# Patient Record
Sex: Female | Born: 1955 | Race: White | Hispanic: No | Smoking: Never smoker
Health system: Southern US, Community
[De-identification: ages and names within clinical notes are randomized; demographics above are authoritative.]

## PROBLEM LIST (undated history)

## (undated) DIAGNOSIS — F419 Anxiety disorder, unspecified: Secondary | ICD-10-CM

## (undated) DIAGNOSIS — J309 Allergic rhinitis, unspecified: Secondary | ICD-10-CM

## (undated) DIAGNOSIS — M1990 Unspecified osteoarthritis, unspecified site: Secondary | ICD-10-CM

## (undated) DIAGNOSIS — Z860100 Personal history of colon polyps, unspecified: Secondary | ICD-10-CM

## (undated) DIAGNOSIS — N951 Menopausal and female climacteric states: Secondary | ICD-10-CM

## (undated) DIAGNOSIS — I10 Essential (primary) hypertension: Secondary | ICD-10-CM

## (undated) DIAGNOSIS — E785 Hyperlipidemia, unspecified: Secondary | ICD-10-CM

## (undated) DIAGNOSIS — E669 Obesity, unspecified: Secondary | ICD-10-CM

## (undated) DIAGNOSIS — K5792 Diverticulitis of intestine, part unspecified, without perforation or abscess without bleeding: Secondary | ICD-10-CM

## (undated) DIAGNOSIS — E7800 Pure hypercholesterolemia, unspecified: Secondary | ICD-10-CM

## (undated) HISTORY — DX: Essential (primary) hypertension: I10

## (undated) HISTORY — DX: Personal history of colon polyps, unspecified: Z86.0100

## (undated) HISTORY — PX: PELVIC LAPAROSCOPY: SHX162

## (undated) HISTORY — DX: Obesity, unspecified: E66.9

## (undated) HISTORY — DX: Pure hypercholesterolemia, unspecified: E78.00

## (undated) HISTORY — DX: Personal history of colonic polyps: Z86.010

## (undated) HISTORY — DX: Diverticulitis of intestine, part unspecified, without perforation or abscess without bleeding: K57.92

## (undated) HISTORY — DX: Anxiety disorder, unspecified: F41.9

## (undated) HISTORY — DX: Hyperlipidemia, unspecified: E78.5

## (undated) HISTORY — DX: Menopausal and female climacteric states: N95.1

## (undated) HISTORY — PX: APPENDECTOMY: SHX54

## (undated) HISTORY — DX: Unspecified osteoarthritis, unspecified site: M19.90

## (undated) HISTORY — DX: Allergic rhinitis, unspecified: J30.9

---

## 2003-01-10 ENCOUNTER — Other Ambulatory Visit: Admission: RE | Admit: 2003-01-10 | Discharge: 2003-01-10 | Payer: Self-pay | Admitting: Gynecology

## 2004-03-16 ENCOUNTER — Other Ambulatory Visit: Admission: RE | Admit: 2004-03-16 | Discharge: 2004-03-16 | Payer: Self-pay | Admitting: Gynecology

## 2005-03-17 ENCOUNTER — Other Ambulatory Visit: Admission: RE | Admit: 2005-03-17 | Discharge: 2005-03-17 | Payer: Self-pay | Admitting: Gynecology

## 2006-12-15 ENCOUNTER — Other Ambulatory Visit: Admission: RE | Admit: 2006-12-15 | Discharge: 2006-12-15 | Payer: Self-pay | Admitting: Gynecology

## 2007-05-09 ENCOUNTER — Inpatient Hospital Stay (HOSPITAL_COMMUNITY): Admission: RE | Admit: 2007-05-09 | Discharge: 2007-05-11 | Payer: Self-pay | Admitting: Gynecology

## 2007-05-09 ENCOUNTER — Encounter: Payer: Self-pay | Admitting: Gynecology

## 2007-05-27 ENCOUNTER — Encounter: Payer: Self-pay | Admitting: Gynecology

## 2007-05-27 ENCOUNTER — Inpatient Hospital Stay (HOSPITAL_COMMUNITY): Admission: AD | Admit: 2007-05-27 | Discharge: 2007-07-02 | Payer: Self-pay | Admitting: General Surgery

## 2007-06-05 ENCOUNTER — Ambulatory Visit: Payer: Self-pay | Admitting: Infectious Diseases

## 2007-07-07 ENCOUNTER — Ambulatory Visit (HOSPITAL_COMMUNITY): Admission: RE | Admit: 2007-07-07 | Discharge: 2007-07-07 | Payer: Self-pay | Admitting: General Surgery

## 2007-07-21 ENCOUNTER — Ambulatory Visit: Admission: RE | Admit: 2007-07-21 | Discharge: 2007-07-21 | Payer: Self-pay | Admitting: Gynecology

## 2007-07-24 ENCOUNTER — Ambulatory Visit (HOSPITAL_COMMUNITY): Admission: RE | Admit: 2007-07-24 | Discharge: 2007-07-24 | Payer: Self-pay | Admitting: General Surgery

## 2007-08-10 ENCOUNTER — Ambulatory Visit (HOSPITAL_COMMUNITY): Admission: RE | Admit: 2007-08-10 | Discharge: 2007-08-10 | Payer: Self-pay | Admitting: General Surgery

## 2007-08-31 ENCOUNTER — Ambulatory Visit (HOSPITAL_COMMUNITY): Admission: RE | Admit: 2007-08-31 | Discharge: 2007-08-31 | Payer: Self-pay | Admitting: General Surgery

## 2007-10-16 ENCOUNTER — Ambulatory Visit (HOSPITAL_COMMUNITY): Admission: RE | Admit: 2007-10-16 | Discharge: 2007-10-16 | Payer: Self-pay | Admitting: General Surgery

## 2007-11-08 ENCOUNTER — Ambulatory Visit (HOSPITAL_COMMUNITY): Admission: RE | Admit: 2007-11-08 | Discharge: 2007-11-08 | Payer: Self-pay | Admitting: General Surgery

## 2008-01-23 ENCOUNTER — Encounter (INDEPENDENT_AMBULATORY_CARE_PROVIDER_SITE_OTHER): Payer: Self-pay | Admitting: General Surgery

## 2008-01-23 ENCOUNTER — Inpatient Hospital Stay (HOSPITAL_COMMUNITY): Admission: RE | Admit: 2008-01-23 | Discharge: 2008-01-31 | Payer: Self-pay | Admitting: General Surgery

## 2008-02-22 ENCOUNTER — Encounter (INDEPENDENT_AMBULATORY_CARE_PROVIDER_SITE_OTHER): Payer: Self-pay | Admitting: General Surgery

## 2008-02-22 ENCOUNTER — Inpatient Hospital Stay (HOSPITAL_COMMUNITY): Admission: RE | Admit: 2008-02-22 | Discharge: 2008-03-01 | Payer: Self-pay | Admitting: General Surgery

## 2008-06-10 IMAGING — CT CT PELVIS W/O CM
1 of 2 series · 15 of 32 positions shown, 19 images · non-contrast
Comparison: Numerous priors most recent 06/20/2007

ABDOMEN CT WITHOUT CONTRAST

CLINICAL DATA: Followup pelvic abscesses.
TECHNIQUE: Multidetector CT imaging of the abdomen and pelvis was performed
following the standard protocol without administration of intravenous contrast.

[Series 2: abd_pel 5.0 b40f st · axial · 0.72mm/px · z∈[-400,+6]mm · 15 of 89 slices shown, 19 images]
[im 4/89  soft-tissue]
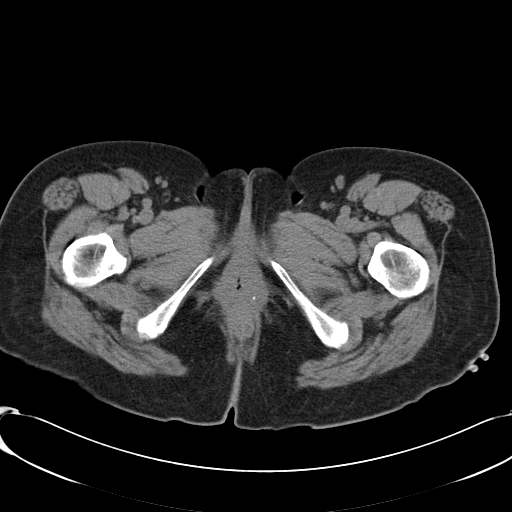
[im 4/89  bone]
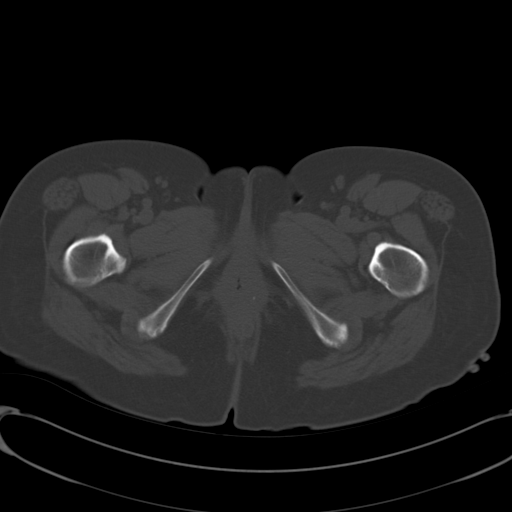
[im 11/89  soft-tissue]
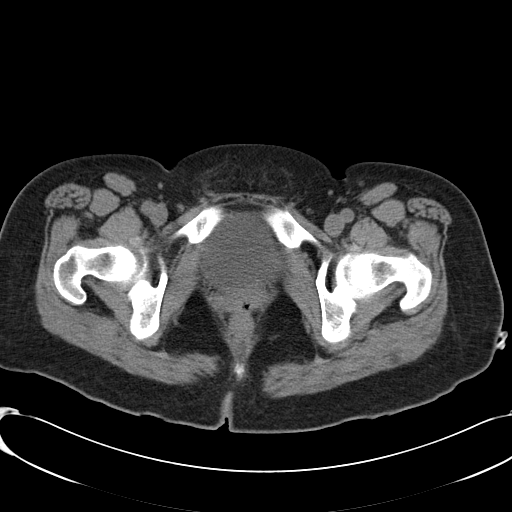
[im 18/89  soft-tissue]
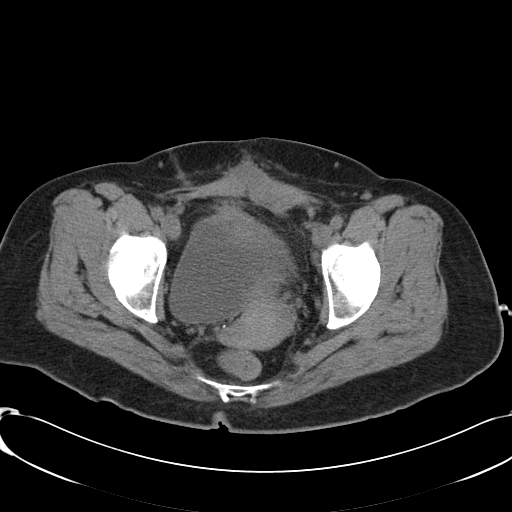
[im 25/89  soft-tissue]
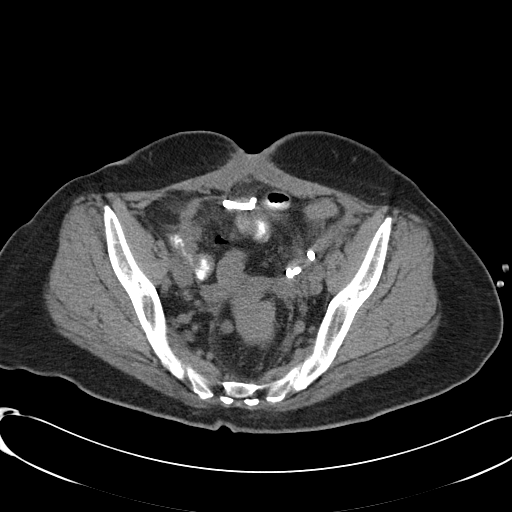
[im 32/89  soft-tissue]
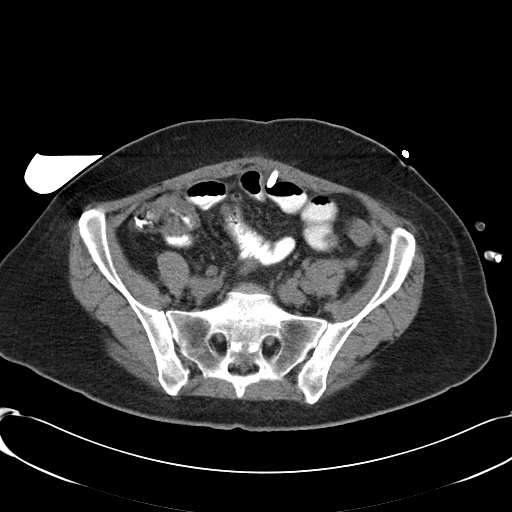
[im 39/89  soft-tissue]
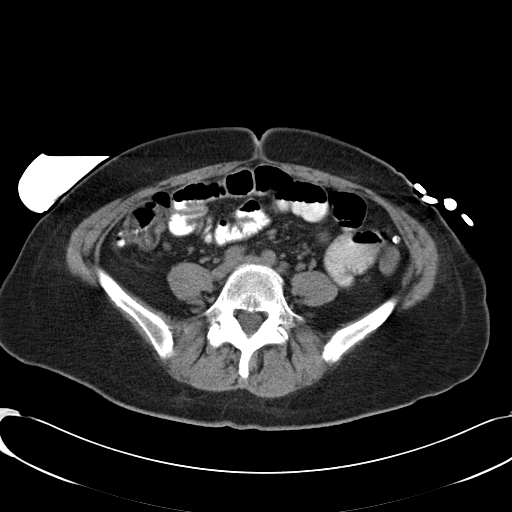
[im 46/89  soft-tissue]
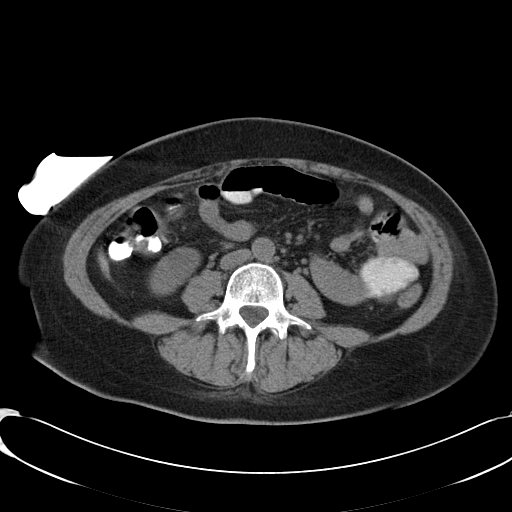
[im 50/89  soft-tissue]
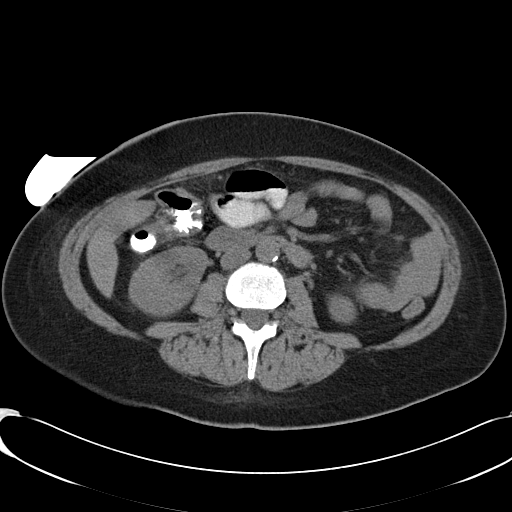
[im 57/89  soft-tissue]
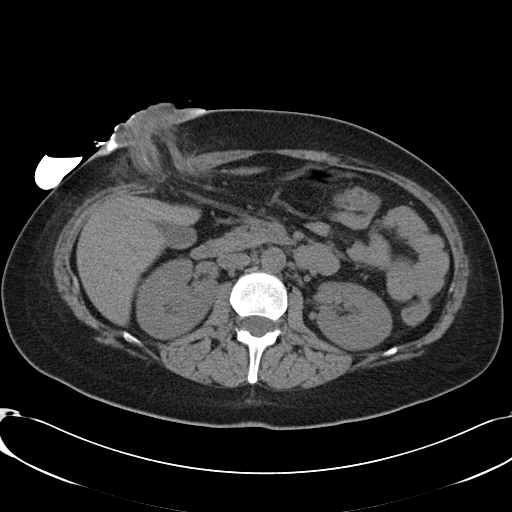
[im 57/89  bone]
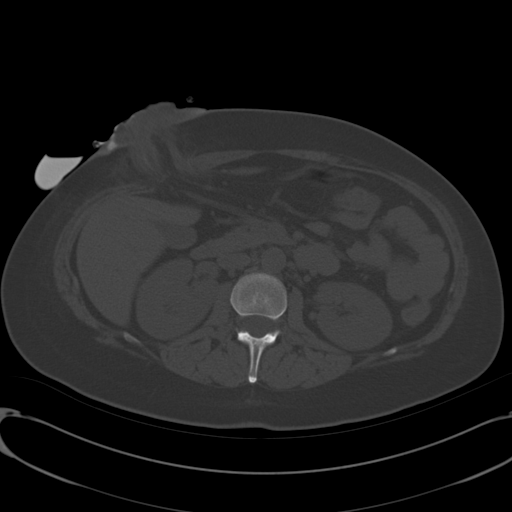
[im 64/89  soft-tissue]
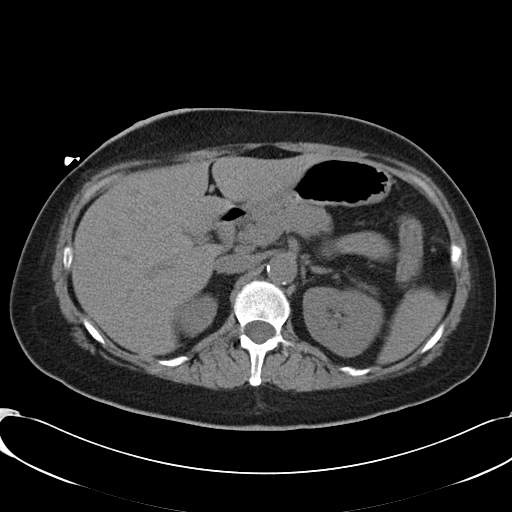
[im 71/89  soft-tissue]
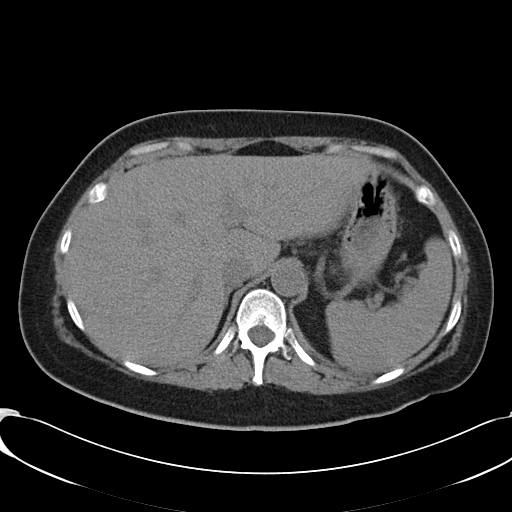
[im 74/89  lung]
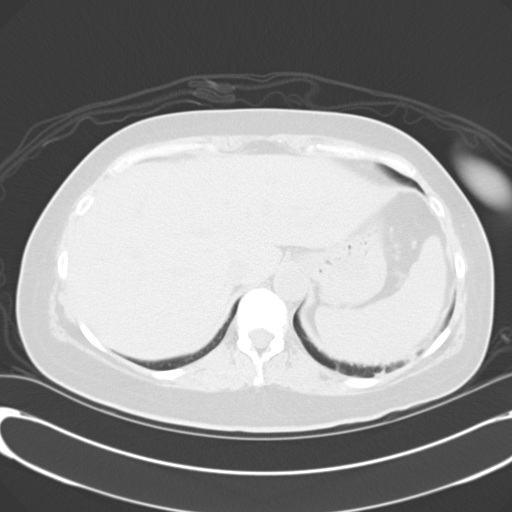
[im 78/89  soft-tissue]
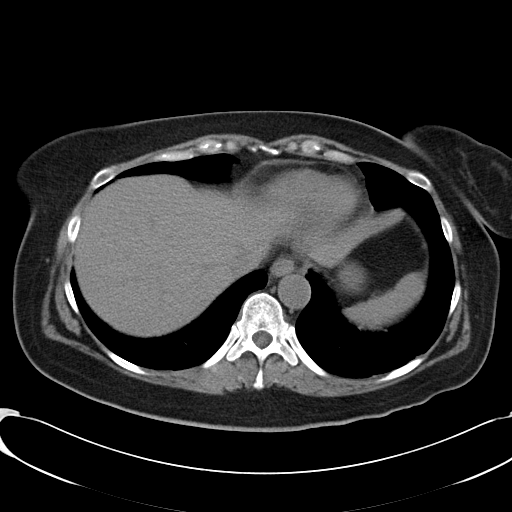
[im 78/89  lung]
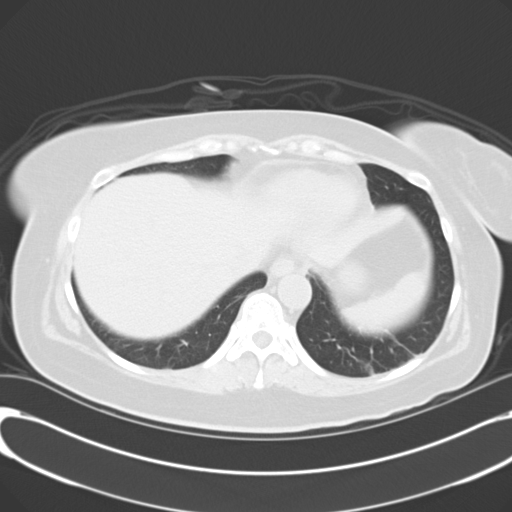
[im 81/89  lung]
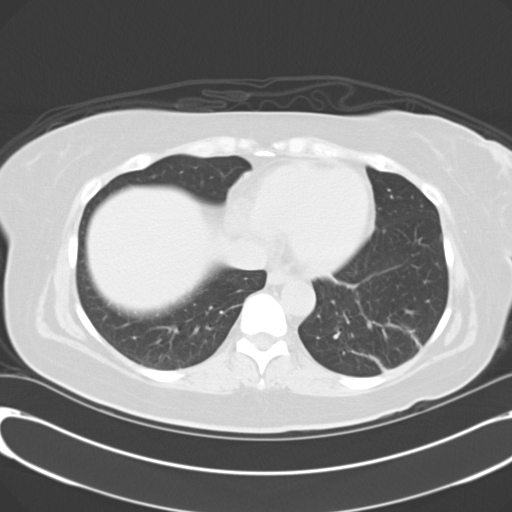
[im 85/89  soft-tissue]
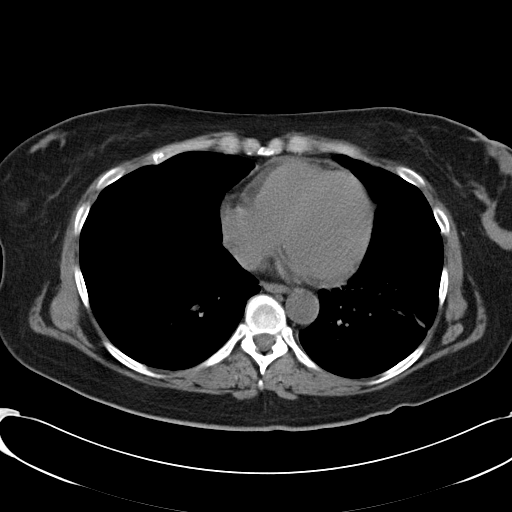
[im 85/89  lung]
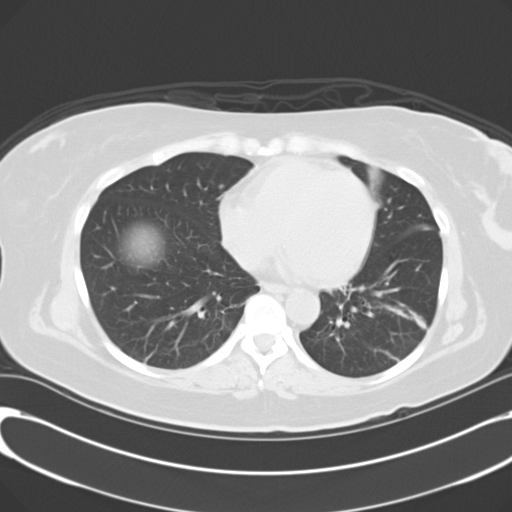

[15 of 32 positions shown; findings below may reference images not displayed]

FINDINGS: Left base atelectasis. Normal uninfused appearance of liver, spleen,
stomach, pancreas, gallbladder, adrenal glands, kidneys.

No retroperitoneal or retrocrural adenopathy.

Right upper quadrant hepatic flexure colostomy. No contrast within the distal
colon on the current exam. Normal caliber. Contrast within proximal colon and
remainder of small bowel. The extent of small bowel opacification with contrast
is poor. The anterior central lower abdomen/upper pelvis catheter is without
residual abscess on image 62. Trace adjacent fluid. Normal caliber small bowel.

Again identified are low density nodules in the small bowel mesentery. Example
1.6 cm compared with 2.1 cm on prior exam.

IMPRESSION

1. Improved appearance of the abdomen with resolved low anterior abdomen/upper
pelvis collection with percutaneous drainage catheter in place.
2. Although the enteric opacification is poor, no evidence of residual fistulous
communication to the distal colon.
3. Decreased low-density foci within the small bowel mesentery likely related to
resolving reactive lymph nodes or fluid within mesenteric leaves.

PELVIS CT WITHOUT CONTRAST
FINDINGS: The left lower quadrant drain terminates on images 67 without
evidence of residual collection. No contrast in this area to suggest residual
fistulous communication.

fluid collection. Decrease since prior exam.

Pelvic adenopathy with left common iliac node at 8 mm today compared with 9 mm
on prior.

Normal urinary bladder and uterus. Normal bones.

IMPRESSION

1. Improved appearance of the pelvis with resolved left pelvic abscess.
2. Residual foci of mesenteric extraluminal air, improved since the prior exam.
3. Improved/resolved pelvic adenopathy.

## 2011-02-23 NOTE — Op Note (Signed)
[name], SCHLEGEL                ACCOUNT NO.:  [id number]   MEDICAL RECORD NO.:  [id number]          PATIENT TYPE:  INP   LOCATION:  1435                         FACILITY:  [facility]   PHYSICIAN:  Adolph Pollack, M.D.DATE OF BIRTH:  24-Apr-1956   DATE OF PROCEDURE:  06/15/2007  DATE OF DISCHARGE:                               OPERATIVE REPORT   PREOPERATIVE DIAGNOSIS:  Sigmoid colon fistula, complex pelvic abscess.   POSTOPERATIVE DIAGNOSIS:  Sigmoid colon fistula, complex pelvic abscess.   PROCEDURE:  Transverse loop colostomy.   SURGEON:  Adolph Pollack, M.D.   ANESTHESIA:  General.   INDICATIONS:  [name] is a 54 year old female who underwent a left  salpingo-oophorectomy, wound infection and then development of a pelvic  abscess that was complex.  She was noted to have a leak from her sigmoid  colon.  Initially she was treated with drainage, dietary restriction and  then bowel rest.  However the leak was difficult to control and she  continues to have low grade infection as well as feculent drainage.  This has been after almost 3 weeks of attempt of nonoperative  management.  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  I feel that the best thing for her is to do a loop  colostomy to divert fecal stream and allow this to heal  and thus she  presents for that.  We discussed the procedure and risks preoperatively.   TECHNIQUE:  She is brought to the operating room, placed supine on the  operating table and general anesthetic was administered.  The upper  abdominal wall was sterilely prepped and draped.  A small transverse  incision was made in the right upper quadrant inferior to the costal  margin through skin, subcutaneous tissue, anterior fascia.  The rectus  muscles retracted medially and posterior fascia peritoneum incised.  I  then delivered the omentum up with a loop of proximal transverse colon.  The omentum was dissected free  from the transverse colon.  The  pericolonic fatty tissue was carefully dissected using electrocautery  and small vessels divided and ligated.  Staying close to wall of the  colon,  I was able to create a window around it and put a Penrose drain  through this and brought it up into the incision.  I then placed a  colostomy bar through the small window and anchored it to the skin with  a 4-0 nylon suture.  I then opened the colon transversely and then  matured the colostomy with interrupted 3-0 Vicryl sutures.  A large  appliance was placed.   She tolerated procedure without any apparent complications, was taken to  recovery in satisfactory condition.      Adolph Pollack, M.D.  Electronically Signed     TJR/MEDQ  D:  06/15/2007  T:  06/16/2007  Job:  [id number]

## 2011-02-23 NOTE — Discharge Summary (Signed)
[name], [name]                ACCOUNT NO.:  [id number]   MEDICAL RECORD NO.:  [id number]          PATIENT TYPE:  INP   LOCATION:  1523                         FACILITY:  [facility]   PHYSICIAN:  Adolph Pollack, M.D.DATE OF BIRTH:  1956/05/06   DATE OF ADMISSION:  01/23/2008  DATE OF DISCHARGE:  01/31/2008                               DISCHARGE SUMMARY   FINAL DISCHARGE DIAGNOSIS:  Recurrent colocutaneous fistula   SECONDARY DIAGNOSES:  1. Hypertension.  2. Left ovarian stromal tumor.  3. Colostomy.  4. Malnutrition.   PROCEDURES:  Transverse loop colostomy closure and incision and  debridement of left lower quadrant wound.   REASON FOR ADMISSION:  This 55 year old female underwent a left  oophorectomy which was difficult because of adhesions.  Postoperatively,  she developed a large left lower quadrant and after drainage was noted  to have a fistula.  Over many months the fistula eventually closed and  the drain was removed.  The fistula closure was documented  radiographically.  She had had a diverting transverse loop colostomy to  divert the fecal stream in order to help the fistula close and was  admitted for closure of the loop colostomy and debridement of left lower  quadrant wound.   HOSPITAL COURSE:  She was admitted and underwent the above procedures  which she tolerated well.  On postop day one, she looked good.  Her diet  was advanced.  By postop day number two she had saturated her left lower  quadrant bandage with feculent output and had reopening of her  colocutaneous fistula in the left lower quadrant.  A stoma appliance was  applied.  She was started on intravenous antibiotics and bowel rest.  I  started her also on TNA and obtained a CT scan which showed a mature  fistula tract.  After discussing with her and noting a prealbumin of  8.0, it was decided to put her on TNA and allow the fistula and  inflammation to subside.  When she was on cyclic TNA, she  was able to be  discharged to home.   DISPOSITION:  Discharged home on January 31, 2008.  She has a stoma  appliance over the fistula which is draining minimally but does have  some gas in it.  She is taking some sips of liquids and tolerating the  TNA well.   The plan will be to bring her back in approximately 3 to 4 weeks and  perform exploratory laparotomy with segmental resection of the sigmoid  colon and debridement of the fistula tract with hopefully primary  anastomosis or possible loop ileostomy.  We had a long discussion about  this.  She will follow up in my office on April 29 at 1:15 p.m. for  removal of the loop ileostomy closure site staples.     Adolph Pollack, M.D.  Electronically Signed    TJR/MEDQ  D:  02/12/2008  T:  02/12/2008  Job:  [id number]

## 2011-02-23 NOTE — Op Note (Signed)
NAMEMIKELE, SIFUENTES                ACCOUNT NO.:  [id number]   MEDICAL RECORD NO.:  [id number]          PATIENT TYPE:  INP   LOCATION:  1528                         FACILITY:  [facility]   PHYSICIAN:  Excell Seltzer. Annabell Howells, M.D.    DATE OF BIRTH:  26-Feb-1956   DATE OF PROCEDURE:  02/22/2008  DATE OF DISCHARGE:                               OPERATIVE REPORT   PROCEDURE:  Closure of enterovesical fistula.   PREOPERATIVE DIAGNOSIS:  Enterovesical fistula.   POSTOPERATIVE DIAGNOSIS:  Enterovesical fistula.   SURGEON:  Excell Seltzer. Annabell Howells, M.D.   ASSISTANT:  Adolph Pollack, M.D.   ANESTHESIA:  General.   DRAINS:  Foley catheter and ureteral catheters.   COMPLICATIONS:  None.   INDICATIONS:  Ms. Broeker is a 55 year old white female with history of  complicated pelvic abscess who has undergone exploratory laparotomy, and  during this procedure she was found to have adherence to the dome of the  bladder with dissection.  The bladder was opened in two fistulous areas.  I was called back into the procedure at which point I resected the  indurated bladder wall on the dome, inspected the anterior the bladder  where no obvious additional fistulous tracts were identified.  The  bladder was then closed in two layers using running 2-0 Vicryl sutures.  The bladder was then pressure tested with indigo carmine diluted in  saline, and no leakage was noted.   At this point, the procedure was returned to Dr. Abbey Chatters who  completed his closure.  There were no complications during my portion of  the procedure.      Excell Seltzer. Annabell Howells, M.D.  Electronically Signed     JJW/MEDQ  D:  02/22/2008  T:  02/22/2008  Job:  [id number]

## 2011-02-23 NOTE — Consult Note (Signed)
[name], [name]                ACCOUNT NO.:  [id number]   MEDICAL RECORD NO.:  [id number]          PATIENT TYPE:  MAT   LOCATION:  MATC                          FACILITY:  [facility]   PHYSICIAN:  M. Leda Quail, MD  DATE OF BIRTH:  [birth year]   DATE OF CONSULTATION:  05/27/2007  DATE OF DISCHARGE:  05/27/2007                                 CONSULTATION   ER CONSULTATION.   [name] is a very pleasant 55 year old G1 P1, divorced white female,  who called me late this evening, as the on-call physician, reporting  that she was having some questionable fever, flushing, chills as well as  increased left lower quadrant pain.  As well, she has been experiencing  worsening diarrhea over the last 48 hours that does have a significant  odor to it.  The patient underwent an ex-lap, LSO, lysis of adhesions  due to endometriosis in the end of July.  The patient reports she is  about 3 weeks out from her surgery.  Her Operative Note has been  reviewed and there appeared to be extensive adhesions.  The patient  reports that she was planning to undergo a hysterectomy, but because of  the amount of adhesions the surgery was stopped.  Patient reported about  a week after her surgery she started having some drainage from her  incision.  The incision has come open.  She had some debridement done by  Dr. Audie Box with dressing changes and had a vac placed today.  She  called initially to request oral antibiotics, but given her history I  felt it was appropriate to evaluate her in the Maternity Admissions area  at [facility].  She does say that the pain on the left side has  been getting worse today.  This pain is somewhat similar to what she had  before her surgery, however it had improved some after the surgery  during the last 3 weeks.  It does to her seem like it is worsened in the  last 48 hours.   REVIEW OF SYSTEMS:  Significant for flushing for temperature elevation  of 99 at home,  chills.  She has had diarrhea with odor for 48 hours.  She has had some mild shortness of breath this evening which she feels  has gotten worse since the pain has worsened.  She has no palpitations.  She had some back pain the last 24 hours, the left-sided abdominal pain.  No dysuria, no hematuria, no vaginal bleeding, no vaginal discharge.  No  lower extremity swelling, no unusual bruising.  She has had decreased  appetite.  When the pain gets better she has eaten some and then the  diarrhea occurs worse and so she stops eating and has been in a cycle of  this for the last couple days.   PAST MEDICAL HISTORY:  Significant only for:  1. Endometriosis.  2. Hypertension.   ALLERGIES:  No known drug allergies.   MEDICATIONS:  1. Lisinopril 20 mg p.o. daily.  2. She has been taking Percocet and Vicodin, but the Percocet does  give her unusual dreams so she has mostly been using Vicodin for      pain.   GYN HISTORY:  1. G1, P1.  2. She had an SVD of a 25 year-old son.  3. She is menopausal since some time in her early 19's.   PAST SURGICAL HISTORY:  Significant for ex-lap 15 years ago due to  endometriosis and her most recent surgery at the end of July.   SOCIAL HISTORY:  1. She is divorced.  2. She lives here in [place].  3. 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  4. Negative x3 for smoking, drugs or alcohol.   PHYSICAL EXAM:  Pulse initially was 101, but as the patient rested was  88, respirations 20, temp 97.5 and then 99, BP 119/70.  GENERAL:  She is a well-nourished, well-developed, slightly obese white  female who does appear in no acute distress but she does appear mildly  uncomfortable and tired.  HEENT:  Normocephalic, atraumatic.  NECK:  Supple without masses.  TRACHEA:  Midline.  THYROID:  Not enlarged and nontender.  No palpable lymph nodes.  CARDIOVASCULAR:  Regular rate and rhythm without murmurs, rubs or  gallops.  LUNGS:  Clear to  auscultation bilaterally with good respiratory effort.  FLANK:  No CVA tenderness.  ABDOMEN:  She has left lower quadrant tenderness to palpation and some  right lower quadrant tenderness to palpation, no significant guarding,  no rebound.  She does not have an acute abdomen.  She has hyperactive  bowel sounds, no masses palpable, no hepatosplenomegaly or hernias  noted.  She does have an open wound that has a vac in place but there is  excellent granulation tissue noted.  The vac is still in place today and  granulation tissue can be seen around it.  At this moment I decided not  to go ahead and remove it.  GYN:  Normal appearing external female genitalia, atrophic vaginal  tissue, no vaginal discharge or bleeding.  BIMANUAL EXAM:  Shows a midline uterus with some cervical motion  tenderness and some tenderness on the left side to palpation just like  on her abdominal exam.  The urethra, urethral meatus and bladder appear  normal and nontender.  EXTREMITIES:  No clubbing, cyanosis or edema, no unusual bruises or  rashes.   LABS:  White count was 18.1, a diff is pending.  Hemoglobin was 10.9,  platelet count was 961,000.  Electrolytes:  Sodium 131, potassium 3.2,  chloride 90, glucose 104, BUN 12, creatinine 0.74.  Other parts of the  CMP were normal except for an albumin which was decreased at 2.1, her  GFR is normal at greater than 60.  UA is pending at this time.  A CT of  the abdomen and pelvis has been ordered with oral and IV contrast.  At  this time I am waiting for this to be done.   ASSESSMENT AND PLAN:  24. A 55 year old gravida 1 para 1 married white female who is three      weeks out from exploratory laparotomy, left salpingo-oophorectomy,      lysis of adhesions who is concerning for a pelvic abscess.  I am      going to go ahead and start her on antibiotics at this time Mefoxin      2 grams IV every 6 hours, Flagyl 1 gram and then 500 mg every 8      hours, and Levaquin  750 mg IV every  24 hours.  2. CAT scan at this point is pending as she has started to drink the      oral contrast.   Additional treatment plan will be decided upon pending the results of  her CAT scan.      Lum Keas, MD  Electronically Signed     MSM/MEDQ  D:  05/27/2007  T:  05/28/2007  Job:  [id number]   cc:   Marcial Pacas P. Fontaine, M.D.  Fax: [phone]

## 2011-02-23 NOTE — Consult Note (Signed)
[name], [name] NO.:  [id number]   MEDICAL RECORD NO.:  [id number]          PATIENT TYPE:  OUT   LOCATION:  GYN                          FACILITY:  [facility]   PHYSICIAN:  De Blanch, M.D.DATE OF BIRTH:  01-11-1956   DATE OF CONSULTATION:  07/21/2007  DATE OF DISCHARGE:                                 CONSULTATION   CHIEF COMPLAINT:  Ovarian tumor.   HISTORY OF PRESENT ILLNESS:  This is a 55 year old white female seen in  consultation at the request of Dr. 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  The patient underwent exploratory laparotomy and left salpingo-  oophorectomy on May 09, 2007 for a symptomatic left adnexal mass which  was complex and approximately 7 cm in diameter.  The patient has a past  history of endometriosis and extensive adhesive disease was encountered.  Left salpingo-oophorectomy was accomplished.  Pathology showed this to  be a gonadal stromal tumor of indeterminate type (6 mm) associated with  endometriosis.  Pathology had been reviewed by Dr. Ria Comment at  [facility] who concurs.  The tumor was stained and was  positive for inhibin.   The patient's postoperative course was complicated by a leak from her  sigmoid colon which did not resolve with conservative management and she  has subsequently undergone a transverse loop colostomy on June 15, 2007.   PAST MEDICAL HISTORY:  MEDICAL ILLNESSES:  Mild hypertension.   PAST SURGICAL HISTORY:  Exploratory laparotomy and ovarian cystectomy  for endometriosis.  Left salpingo-oophorectomy.  Diverting loop colostomy.   ALLERGIES:  No drug allergies.   CURRENT MEDICATIONS:  None (the patient previously was taking lisinopril  but has not taken it since hospital discharge).   FAMILY HISTORY:  Negative for gynecologic, breast or colon cancer.   OBSTETRICAL HISTORY:  Gravida  1.   SOCIAL HISTORY:  The patient is divorced.  She works in [employer]. Does not smoke.   GYNECOLOGIC HISTORY:  The patient denies abnormal Pap smears.  She went  through menopause approximately 10 years ago. She took hormone  replacement therapy for 3-4 years.  Currently she is using Prozac for  hot flushes.   REVIEW OF SYSTEMS:  A 10 point comprehensive review of systems negative  except as noted above.   PHYSICAL EXAMINATION:  VITAL SIGNS:  Height 5 feet, 3 inches, weight  115.50 pounds.  Blood pressure 140/90, pulse 80, respiratory rate 18.  GENERAL:  The patient is a healthy white female in no acute distress.  PHYSICAL EXAM -  is not performed today.   IMPRESSION:  A gonadal stromal tumor of indeterminate type.  I had a  lengthy description to the patient regarding the natural history of  gonadal stromal tumors.  Primary treatment is surgical resection which  has been accomplished.  At this juncture I do not believe any additional  surgery or adjuvant therapy would be of any benefit to the patient.  I  did make her aware that there is a small chance of  local lymph node  recurrence.   With regard to management I would recommend the patient undergo a  followup CT scan in approximately 6 months and use annual CT scans as a  method for surveillance.  In addition, tumor marker inhibin may be of  value and we will therefore draw that study in the laboratory today and  monitor it every 6 months.  Will ask the patient to return to see [place] in  6 months for reevaluation and repeat her tumor markers.  Certainly if  she requires subsequent pelvic surgery to repair her colonic leak, one  may consider completion of the right salpingo-oophorectomy and  hysterectomy at that time.      De Blanch, M.D.  Electronically Signed     DC/MEDQ  D:  07/21/2007  T:  07/21/2007  Job:  [id number]   cc:   Nadyne Coombes. Fontaine, M.D.  Fax: [phone]   Telford Nab, R.N.   [address]  [place], [place] [address]   Adolph Pollack, M.D.  [address]  [place]  [place] [address]

## 2011-02-23 NOTE — Op Note (Signed)
NAMECAROLIN, QUANG                ACCOUNT NO.:  [id number]   MEDICAL RECORD NO.:  [id number]          PATIENT TYPE:  INP   LOCATION:  1528                         FACILITY:  [facility]   PHYSICIAN:  Excell Seltzer. Annabell Howells, M.D.    DATE OF BIRTH:  12-26-1955   DATE OF PROCEDURE:  02/22/2008  DATE OF DISCHARGE:                               OPERATIVE REPORT   PROCEDURE:  Cystoscopy with insertion of bilateral ureteral catheters.   PREOPERATIVE DIAGNOSIS:  Pelvic abscess.   POSTOPERATIVE DIAGNOSIS:  Pelvic abscess with bullous edema on the dome  of the bladder suggestive of fistula.   SURGEON:  Dr. Bjorn Pippin.   ANESTHESIA:  General.   DRAINS:  Bilateral 5-French ureteral catheters and an 18-French Foley  catheter.   COMPLICATIONS:  None.   INDICATIONS:  Ms. Nawrot is a 55 year old white female with a  complicated pelvic abscess with a colovesical fistula who had initially  undergone a diverting colostomy, but when the colostomy was closed, the  fistula reopened.  On CT scan, there was some involvement of the wall of  the bladder with suggestion of possible fistula.   FINDINGS OF THE PROCEDURE:  The patient was began on antibiotics per Dr.  Abbey Chatters.  Under a general anesthetic, she was placed in lithotomy  position.  Her perineum and genitalia were prepped with Betadine  solution.  She was draped in usual sterile fashion.  Cystoscopy was  performed using the 22-French scope and 12-degree lens.  Examination  revealed a normal urethra.  The bladder wall was generally unremarkable  with exception of some of bullous edema on the dome.  It did not appear  to be associated with inflammation.  No obvious fistulous tract was  identified.  No tumors or stones were seen.  Ureteral orifices were  unremarkable.   The left ureteral orifice was cannulated with a sensor dual core  guidewire which was passed easily through the kidney under fluoroscopic  guidance.  The 5-French open-end catheter was  then inserted over the  wire.  The wire was removed once the catheter had reached the kidney.  This was then repeated on the right side without complications.  The  Foley catheter was then reinserted after removal the cystoscope.  The  two stents were secured to the Foley with silk ties and then placed to  straight drainage using a Paramedic.  At this point,  Dr. Abbey Chatters took over to perform his abdominal exploration.  There  were no complications during this portion of the procedure.     Excell Seltzer. Annabell Howells, M.D.  Electronically Signed    JJW/MEDQ  D:  02/22/2008  T:  02/22/2008  Job:  [id number]

## 2011-02-23 NOTE — Op Note (Signed)
[name], [name]                ACCOUNT NO.:  [id number]   MEDICAL RECORD NO.:  [id number]          PATIENT TYPE:  INP   LOCATION:  0005                         FACILITY:  [facility]   PHYSICIAN:  Adolph Pollack, M.D.DATE OF BIRTH:  September 02, 1956   DATE OF PROCEDURE:  02/22/2008  DATE OF DISCHARGE:                               OPERATIVE REPORT   PREOPERATIVE DIAGNOSIS:  Recurrent colocutaneous fistula.   POSTOPERATIVE DIAGNOSES:  1. Recurrent colocutaneous fistula.  2. Interloop small-bowel abscess.  3. Colovesical fistulas.   PROCEDURE:  1. Exploratory laparotomy.  2. Lysis of adhesions (90 minutes).  3. Drainage of interloop small-bowel abscess.  4. Sigmoid colectomy with mobilization of splenic flexure.  5. Debridement of fistula tract.   SURGEON:  Adolph Pollack, M.D.   ASSISTANT:  Lorne Skeens. Hoxworth, M.D.   ANESTHESIA:  General.   INDICATIONS:  This 55 year old female has had a complex course with a  colocutaneous fistula.  Despite diversion and closure, recurred after  diverting loop colostomy was closed.  She was malnourished and went home  on parenteral nutrition, and now returns for this procedure.  The plan  is to have Dr. Bjorn Pippin place ureteral stents in.   TECHNIQUE:  She was seen in the holding area, and brought to the  operating room, and placed supine on the operating room where general  anesthetic was administered.  Dr. Annabell Howells then placed ureteral stents.  Upon cystoscopy he noted an area of abnormality at the dome of the  bladder, left side.  However, no evidence of a fistula.  Abdominal wall  was then sterilely prepped and draped as well as the perineal area.  A  long midline incision was made dividing the skin and subcutaneous tissue  and entering the peritoneal cavity at the upper midline.  As I extended  the incision inferiorly, there were adhesions between the omentum and  the anterior abdominal wall, and these were taken down allowing for  the  incision to be extended inferiorly to a previous scar.  A self-retaining  retractor was used.   Upon exploring the abdomen, multiple adhesions between the small bowel,  the small-bowel and small-bowel to pelvis were noted.  There was one  area that appeared be abnormal with multiple loops of small bowel  attached to each other.  Upon manipulating this, I discovered an  interloop abscess which I cultured, and was then able to debride and  drain.  I did not see any obvious leak from the small intestine.  There  were a few serosal injuries which were repaired.  I then continued to  lyse adhesions mobilizing the small bowel out of the pelvis.   Following this I identified the diseased segment of the sigmoid colon  and the fistula.  There were dense adhesions between the sigmoid colon,  and the lateral sidewall.  I began actually by mobilizing the left  colon, dividing the lateral attachments, allowing me to medialized it  some.  There were significant adhesion between what appeared to be the  distal sigmoid colon and the uterus.  There also was  a part of the  sigmoid colon that was densely adherent to the anterior dome area of the  urinary bladder on the left side.  The sigmoid colon took a very sharp  turn at this level leading to a decreased lumen size.   Then, again, using careful blunt dissection and electrocautery, I began  mobilizing the diseased segment of the sigmoid colon off the lateral  sidewall by dividing the dense adhesions.  I was able to palpate both  the stents in the right and left ureters, trying to keep my plane of  dissection free from this area.  By using some blunt dissection, I also  separated the normal segment of colon from the bladder but found that  there were two connections to the bladder, thus too tight fistulas with  bladder defects left behind after this colon was separated.   I picked a point at the descending colon and sigmoid colon junction to   divide the sigmoid colon.  Then, using this as a handle, I was able to  stay close to divide the mesocolon, and then very gently dissect the  colon free from the lateral sidewall and fistula tract without injury to  the ureter or major vessels.  All told, I spent 90 minutes lysing all  these adhesions.   Following this, then, gently separated the uterus from the rectosigmoid  area of the colon; and then cleaned off the mid-to-distal rectal area of  its mesentery using the LigaSure device and dividing the mesentery,  staying in the midline, using the LigaSure device.  When I found soft,  normal rectum; I then divided the rectum with a linear cutting stapler,  and I passed the specimen which was sigmoid colon and proximal rectum  off the field with the distal portion marked with a suture.   Following this, I then mobilized the descending colon more, and  mobilized to the splenic flexure to the descending colon stump which  easily fell into the pelvis.  I then partially removed the descending  colon staple line and placed a size 29 EEA anvil into the descending  colon.  I closed this colotomy defect with the GIA stapler, and brought  the anvil out the side of the descending colon in a Baker-type fashion  and used a pursestring suture of 2-0 Prolene to hold the anvil in place.  I then performed an EEA anastomosis, end-of-rectum to side-of-colon the  site. Fingers were placed in vagina to ensure that it was not involved.  After the anastomosis was performed, two complete donuts were noted.  An  air leak test was then performed, and no evidence of air leak was noted.  The anastomosis was not under any tension.  It was viable.   Following this, Dr. Bjorn Pippin came in to repair the defects from the  colovesical fistulas which are reported in a separate operative note.  After he had completed this, I copiously irrigated out the abdominal  cavity.  There was little bleeding around the staple line  at the  anastomosis and I placed Surgicel and this resolved.   A stab wound was made in the right lower quadrant, and a 19-Blake drain  was then placed into the pelvis and anchored to the skin with a 3-0  Nylon suture.  Sponge and needle counts were reported to be correct at  this time.  NG-tube position was confirmed in the stomach.  Irrigation  fluid was evacuated as much as possible.  I  saw no evidence of bowel  injury or solid organ injury at this time.   Following this, I then closed the midline fascia with a running #1 PDS  suture.  Subcutaneous tissue was irrigated, and the long incision was  closed with staples.  Bulb suction was placed on the drain.   I then used electrocautery to debride the fistula wound in the left  lower quadrant, and then packed it with moist gauze followed by dry  gauze.  Sterile dressings were applied, and she was extubated and taken  to the recovery room in satisfactory condition.  She tolerated the  procedure well without any apparent complications.      Adolph Pollack, M.D.  Electronically Signed     TJR/MEDQ  D:  02/22/2008  T:  02/22/2008  Job:  [id number]   cc:   Jonny Ruiz L. Rendall, M.D.  Fax: [phone]   Timothy P. Fontaine, M.D.  Fax: [phone]

## 2011-02-23 NOTE — Consult Note (Signed)
[name], [name]                ACCOUNT NO.:  [id number]   MEDICAL RECORD NO.:  [id number]          PATIENT TYPE:  INP   LOCATION:  1607                         FACILITY:  [facility]   PHYSICIAN:  Adolph Pollack, M.D.DATE OF BIRTH:  1956-07-07   DATE OF CONSULTATION:  05/27/2007  DATE OF DISCHARGE:                                 CONSULTATION   REASON FOR CONSULTATION:  Pelvic abscess.   HISTORY OF PRESENT ILLNESS:  [name] is a 55 year old female who  underwent a left salpingo-oophorectomy for left adnexal mass.  At that  time it was planned to do at [facility].  However, she had such dense  adhesions from her previous operation including very dense adhesions  between the left adnexa and sigmoid colon that it was decided it was not  prudent to proceed.  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  Her operation was July 29.  She states that she  actually was progressing fairly well.  She did developed some drainage  from her wound in the early postoperative period and had the wound  opened and dressing changes started.  Then recently has had a V.A.C.  placed.  However, about 3 days ago she noted a change in bowel habits  including intermittent diarrhea, nausea, vomiting, and some fevers.  This persisted as well as lower abdominal pain and she was seen last  night in the emergency department by Dr. Hyacinth Meeker who evaluated her and  noted her to have a leukocytosis.  A CT scan was performed and  demonstrated a pelvic abscess with a mixture of gas and fluid  and a  suggestion that there may be a connection to the sigmoid colon.  I  discussed this with Dr. Hyacinth Meeker last night and we formulated a plan of  admission to the hospital and percutaneous drainage.  I have also  discussed this with the radiologist.  Thus she has been admitted for  that reason.  Dr. Hyacinth Meeker has placed her on  broad-spectrum antibiotics.   PAST MEDICAL HISTORY:  1. Endometriosis.  2. Hypertension.   PAST SURGICAL HISTORY:  Previous abdominal operations include:  1. Exploratory laparotomy.  2. Bilateral ovarian cystectomy.  3. Appendectomy.  4. Recent exploratory laparotomy and lysis of adhesions and left      salpingo-oophorectomy.   HOME MEDICATIONS:  Her home medications include Lisinopril and  fluoxetine at this time as well as pain medication.   ALLERGIES:  None known.   REVIEW OF SYSTEMS:  CARDIAC:  No known heart disease.  PULMONARY:  No  chronic lung disease, asthma, pneumonia.  GI:  Has known diverticulosis  but no peptic ulcer disease.  GU:  No dysuria or hematuria.  HEMATOLOGIC:  No known bleeding disorders or blood clots.   PHYSICAL EXAMINATION:  GENERAL:  A well-developed, well-nourished  female.  She appears to be in no acute distress at this time.  VITAL SIGNS:  Temperature is 98.4, blood pressure is 111/60, pulse of  90.  LUNGS:  Breath sounds equal and clear.  Respirations unlabored.  CARDIOVASCULAR:  Regular rate, regular rhythm.  No murmur.  No lower  extremity edema.  ABDOMEN:  Soft with mild lower abdominal tenderness, but no rebound or  peritoneal signs.  There is a V.A.C. in the lower abdominal incision.  There are hypoactive bowel sounds noted.   LABORATORY DATA:  Notable for her white blood count last night which was  of 18,100, hemoglobin of 10.9.  Potassium was also noted to be slightly  low was 3.2 and urinalysis did not demonstrate any evidence of  infection.   CT was reviewed and discussed with radiologist.   IMPRESSION:  Postoperative pelvic abscess.  Etiology is unclear.  She  could have had a bout of diverticulitis in the postop period and had a  small small perforation.  This appears to be contained to the pelvis.  I  think trying to perform reoperative surgery 18 days postoperatively  would be extremely hazardous.   RECOMMENDATIONS:   Percutaneous drainage of the abscess by interventional  radiology.  Follow the drainage and repeat CT and likely get a  fistulogram eventually to see if there is any connection with the  sigmoid colon.  Will follow with you.      Adolph Pollack, M.D.  Electronically Signed     TJR/MEDQ  D:  05/27/2007  T:  05/28/2007  Job:  [id number]   cc:   Nadyne Coombes. Fontaine, M.D.  Fax: [phone]

## 2011-02-23 NOTE — Discharge Summary (Signed)
[name], [name]                ACCOUNT NO.:  [id number]   MEDICAL RECORD NO.:  [id number]          PATIENT TYPE:  INP   LOCATION:  9315                          FACILITY:  [facility]   PHYSICIAN:  Timothy P. Fontaine, M.D.DATE OF BIRTH:  1956/09/12   DATE OF ADMISSION:  05/09/2007  DATE OF DISCHARGE:  05/11/2007                               DISCHARGE SUMMARY   DISCHARGE DIAGNOSES:  1. Left adnexal mass.  2. Pelvic pain.  3. Severe pelvic adhesions.   PROCEDURE:  Exploratory laparotomy, left salpingo-oophorectomy, lysis of  adhesions, pathology pending.   HOSPITAL COURSE:  The patient underwent exploratory laparotomy for a  left adnexal mass May 09, 2007.  At the time of surgery, she had an  extremely inflammatory left adnexal mass with significant fibrotic  adhesions involving the left pelvic sidewall, uterus and sigmoid.  The  left adnexal mass was removed, although due to the significant sigmoid  uterine posterior cul-de-sac adhesions and cul-de-sac obliteration as  well as right adnexal adhesions, no further surgery was performed.  The  patient's recovery was uncomplicated and she was discharged on  postoperative day #2, ambulating well, tolerating a regular diet with a  postoperative hemoglobin of 12.5.  The patient received precautions,  instructions and follow-up.  She will be seen in the office in 4 days  following discharge for staple removal.  She received a prescription for  Tylox #30 one to two p.o. q.4-6 h p.r.n. pain.      Timothy P. Fontaine, M.D.  Electronically Signed     TPF/MEDQ  D:  05/11/2007  T:  05/11/2007  Job:  [id number]

## 2011-02-23 NOTE — Op Note (Signed)
[name], ROSELLO                ACCOUNT NO.:  [id number]   MEDICAL RECORD NO.:  [id number]          PATIENT TYPE:  AMB   LOCATION:  SDC                           FACILITY:  [facility]   PHYSICIAN:  Timothy P. Fontaine, M.D.DATE OF BIRTH:  Feb 06, 1956   DATE OF PROCEDURE:  05/09/2007  DATE OF DISCHARGE:                               OPERATIVE REPORT   PREOPERATIVE DIAGNOSES:  Left adnexal mass, pelvic pain.   POSTOPERATIVE DIAGNOSES:  Left adnexal mass, pelvic pain.   PROCEDURE:  Exploratory laparotomy and left salpingo-oophorectomy, lysis  of adhesions.   SURGEON:  Dr. Audie Box   ASSISTANT:  Dr. Lily Peer.   ANESTHESIA:  General.   ESTIMATED BLOOD LOSS:  75-100 mL   SPECIMEN:  1. Opening cell washing.  2. Left salpingo-oophorectomy.   COMPLICATIONS:  None.   FINDINGS:  Significant posterior cul-de-sac obliteration with sigmoid  firmly adherent to the posterior aspect of the uterus, right fallopian  tube grossly normal length caliber fimbriated end.  Right ovary not  visualized hidden by the adherent sigmoid and right pelvic sidewall,  palpably normal without mass, left adnexa obscured with the sigmoid  adhesions to left pelvic sidewall. Subsequent freeing up of inflammatory  left mass to include fallopian tube and apparent ovary removed and sent  to pathology.  Uterus limited inspection, upper to mid uterine fundus  grossly normal, lower uterine segment obscured by sigmoid adhesions.  Upper abdominal palpable exam is normal.   PROCEDURE:  The patient was taken to the operating room, underwent  general anesthesia in the supine position, received abdominal perineal  vaginal preparation with Betadine solution and Foley catheter is placed  in sterile technique per nursing personnel.  The patient was draped in  usual fashion. Abdomen was sharply entered in a repeat midline incision  achieving adequate hemostasis at all levels.  Balfour retractor, bladder  blade placed within the  incision and initial pelvic inspection showed  obliteration of the cul-de-sac and obscuring of the left adnexa with  sigmoid adhesions.  Through progressive sharp and blunt dissection the  sigmoid was mobilized from the left adnexa and the firm adnexal mass  with attached swollen fallopian tube was mobilized from the sidewall.  During this process the ureter was dissected and identified and was away  from the operative field.  The infundibulopelvic ligament was  skeletonized and subsequently doubly clamped, cut and doubly ligated  using 0 Vicryl suture.  The left adnexa was progressively sharply  bluntly dissected from the sidewall again care to identify the ureter  away from the operative field and ultimately was freed to the level of  the uterine ovarian pedicle.  During this process the left round  ligament was transected and ligated using 0 Vicryl suture to assist with  this mobilization.  Ultimately the uterine ovarian pedicle was  crossclamped and the specimen was excised, sent to pathology and the  ligament was ligated using 0 Vicryl suture.  It was apparent due to the  dense sigmoid posterior uterine adhesions that further attempts at  freeing the cul-de-sac to allow for hysterectomy would not be prudent  and it was felt to limit our surgery to removal of the left adnexal  mass.  Initial attempts at the right adnexal dissection for  visualization of the ovary and became apparent due to the dense  adhesions that this would not be prudent and given a normal palpable  feel of the adnexa it was further attempts at dissection were stopped.  The pelvis was copiously irrigated.  Surgicel was placed on the left  adnexal sidewall dissection area, although no active bleeding visualized  for prophylaxis given the significant inflammatory appearance.  The  bowel packing was placed initially was removed. The Balfour retractor  bladder blade removed and the anterior fascia was reapproximated  using 0  PDS in a running stitch starting at the angle with the looped suture and  meeting in the middle.  Subcutaneous tissues were irrigated.  The  adequate hemostasis visualized and the skin was reapproximated with  staples with intervening Steri-Strips and Benzoin.  Sterile dressing was  applied.  The patient awakened without difficulty, taken to recovery  room in good condition having tolerated the procedure well.  Of note  upon entry to the abdomen an opening cell washing was taken and sent to  pathology.      Timothy P. Fontaine, M.D.  Electronically Signed     TPF/MEDQ  D:  05/09/2007  T:  05/09/2007  Job:  [id number]

## 2011-02-23 NOTE — Op Note (Signed)
[name], [name]                ACCOUNT NO.:  [id number]   MEDICAL RECORD NO.:  [id number]          PATIENT TYPE:  INP   LOCATION:  0002                         FACILITY:  [facility]   PHYSICIAN:  Adolph Pollack, M.D.DATE OF BIRTH:  [birth year]   DATE OF PROCEDURE:  01/23/2008  DATE OF DISCHARGE:                               OPERATIVE REPORT   PREOPERATIVE DIAGNOSES:  1. Transverse loop colostomy.  2. Chronic draining left lower quadrant wound.   POSTOPERATIVE DIAGNOSES:  1. Transverse loop colostomy.  2. Chronic draining left lower quadrant wound.   PROCEDURES:  1. Transverse loop colostomy closure.  2. Incision and debridement of chronic draining left lower quadrant      wound.   SURGEON:  Adolph Pollack, M.D.   ASSISTANT:  Angelia Mould. Derrell Lolling, M.D.   ANESTHESIA:  General.   INDICATIONS:  This 55 year old female underwent a left salpingo-  oophorectomy.  This was complicated by pelvic abscess and a sigmoid  colon fistula.  She had a transverse looped colostomy, which over a one  month time allowed the fistula to close.  Where the drain site was for  the fistula, she has intermittent drainage; and presents now for  colostomy closure and wound exploration.   TECHNIQUE:  She was seen in the holding area, brought to the operating  room, placed supine on the operating table, and a general anesthetic was  administered.  The Foley catheter was inserted.  The stomal appliance  was removed and the area around the colostomy cleaned.  The abdominal  wall was sterilely prepped and draped.  Using the scalpel, I made an  incision around the loop colostomy to include approximately 4 mm of  skin; went down through the subcutaneous tissue.  This was an elliptical-  type incision.  I used sharp dissection to go down through the  subcutaneous tissue, and identified the fascia.  Using sharp dissection  and electrocautery to control bleeding, I then was able to mobilize the  loop  colostomy from scarring and its attachments; and bring more of the  transverse colon up.  Everything was viable.  I then was able to perform  a side-to-side anastomosis, using a GIA stapler and staking one limb of  the stapler in the afferent and efferent limbs and firing this.  I then  closed the remaining common defect with the TA-90 linear non cutting  stapler; excised the loop colostomy, and then sent it to pathology.   The anastomosis was patent, viable, and under no tension.  The distal  anastomotic line was reinforced with a single 3-0 silk stitch.  I then  was able to drop the anastomosis back into the abdominal cavity.  The  sponge counts were correct at this time.   I then primarily closed the fascia with interrupted #1 Novofil sutures.  I copiously irrigated out the subcutaneous tissue.  I then loosely  closed the skin with staples and used wicks of moistened saline in  between the staples.  A bulky dressing was applied.   Next, I approached a chronic left lower quadrant wound and  opened it up  bluntly and sharply; and then went to a cavity that was approximately 4  cm in depth.  However, there was no fistula present and there was no  foul drainage.  I subsequently just packed this wound with moistened  gauze, followed by a bulky dressing.   She tolerated both procedures well without any apparent complications,  and was taken to the recovery room in satisfactory condition.      Adolph Pollack, M.D.  Electronically Signed     TJR/MEDQ  D:  01/23/2008  T:  01/23/2008  Job:  [id number]   cc:   Nadyne Coombes. Fontaine, M.D.  Fax: [phone]

## 2011-02-23 NOTE — H&P (Signed)
NAMEJASHANTI, [name]                ACCOUNT NO.:  [id number]   MEDICAL RECORD NO.:  [id number]          PATIENT TYPE:  AMB   LOCATION:  SDC                           FACILITY:  [facility]   PHYSICIAN:  Timothy P. Fontaine, M.D.DATE OF BIRTH:  1956-05-06   DATE OF ADMISSION:  DATE OF DISCHARGE:                              HISTORY & PHYSICAL   CHIEF COMPLAINT:  Abdominal pain, persistent left adnexal mass.   HISTORY OF PRESENT ILLNESS:  A 55 year old, G1, P54 female,  postmenopausal historically who initially presented several months prior  to admission with lower abdominal pain.  She underwent evaluation to  include CT scan which showed a 6-cm left adnexal uterine mass consistent  with necrotic tissue.  She was evaluated by gastroenterology and had  follow-up gynecologic ultrasound which showed a persistent left adnexal  mass measuring 5-6 cm, fluid-filled spaces, solid echogenic spaces.  This has persisted on serial scanning.  The patient's abdominal pain  seemed to have eased somewhat but has persisted as a low lying nagging  pain on a daily basis.  Significant in her history is exploratory  laparotomy in 1992 where she was found to have significant endometriosis  bilaterally with bilateral ovarian endometriomas.  She had a posterior  cul-de-sac obliteration, and she underwent exploratory laparotomy,  bilateral ovarian cystectomy and a prophylactic appendectomy.  She is  admitted at this time for exploratory laparotomy, total abdominal  hysterectomy and bilateral salpingo-oophorectomy.   PAST MEDICAL HISTORY:  Significant for hypertension.   PAST SURGICAL HISTORY:  As above.   CURRENT MEDICATIONS:  Lisinopril and fluoxetine.  See medication  reconciliation sheet for full list and dosages.   ALLERGIES:  No medications.   REVIEW OF SYSTEMS:  Noncontributory.   FAMILY HISTORY:  Noncontributory.   SOCIAL HISTORY:  Noncontributory.   ADMISSION PHYSICAL EXAMINATION:  Afebrile, vital  signs are stable.  HEENT:  Normal.  LUNGS:  Clear.  CARDIAC:  Regular rate.  No rubs, murmurs or gallops.  ABDOMINAL EXAM:  Benign.  PELVIC:  External, BUS, vagina normal.  Cervix grossly normal.  Uterus  normal size, midline, mobile, nontender.  Adnexa without masses or  tenderness.   ASSESSMENT:  Persistent left adnexal cystic solid mass with persistent  lower abdominal pain, history of significant endometriosis in the past  and postmenopausal patient.  Options for management were reviewed to  include continued expectant management versus exploratory laparotomy.  The patient desires definitive surgical intervention not only for  diagnostic purposes to rule out carcinoma but to hopefully eliminate or  improve her pain.  She is admitted for a total abdominal hysterectomy,  bilateral salpingo-oophorectomy.  The patient has a realistic  understanding of the proposed surgery.  She understands her history of  significant cul-de-sac adhesions, bilateral ovarian endometrioma  excision in the past with adhesions and the realistic possibility that  we may not be able to accomplish a TAH-BSO, that we may be able to  excise this area or resolved the diagnosis but not be able to remove all  ovarian tissue or the uterus due to adhesions.  She also understands and  accepts the possibility of a supracervical hysterectomy if it is felt  that due to the posterior cul-de-sac adhesive disease that we cannot get  the cervix its entirety she would accept a some total supracervical  hysterectomy.  The realistic possibilities of ovarian remnant syndrome  was also reviewed, and she understands that there is possibility that  ovarian tissue may be left that could be an issue in the future such as  ovarian carcinoma.  Sexuality following hysterectomy was also reviewed,  and the potential for persistent dyspareunia or orgasmic dysfunction  following the procedure was reviewed with her.  No guarantees as far  as  pain relief were made, and her pain may persist, worsen or change  following the procedure, and she understands and accepts this  possibility.  The acute intraoperative postoperative courses were  reviewed and risks discussed to include a realistic risk of internal  organ damage including bowel, bladder, ureters, vessels and nerves.  She  understands the anatomy of the area and the history of significant  scarring in the past.  She will be on a bowel prep preoperatively, but  she understands that there is a risk of damage to these structures that  would require further surgery, possible bowel resection, ostomy  formation, ureteral or bladder surgery.  She understands that this may  be immediate or in subsequent surgeries.  She also understands that  injury may be immediately recognized or delayed recognized following  surgery.  The risks of infection internal requiring prolonged  antibiotics, abscess formation requiring reoperation and abscess  drainage was reviewed.  The risks of wound complications such as  hematoma, seromas or infection requiring opening and draining of  incisions, closure by secondary intention as well as long-term  incisional risks such as hernia formation was all discussed, understood  and accepted.  The risks of hemorrhage necessitating transfusion and the  risks of transfusion were reviewed to include transfusion reaction,  hepatitis, HIV, Mad Cow Disease and other unknown entities was all  discussed, understood and accepted.  We discussed possibilities for  autologous blood donation although the risk of transfusion is low, and  she declines and understands and accepts the risks of transfusion.  The  patient's questions were answered to her satisfaction, and again we  discussed alternatives such as continued expectant management with  serial ultrasonography, but given her pain and discomfort, she wants to  proceed with surgery at this time.      Timothy  P. Fontaine, M.D.  Electronically Signed     TPF/MEDQ  D:  04/28/2007  T:  04/29/2007  Job:  [id number]

## 2011-02-23 NOTE — H&P (Signed)
NAMEAOKI, WEDEMEYER                ACCOUNT NO.:  [id number]   MEDICAL RECORD NO.:  [id number]          PATIENT TYPE:  INP   LOCATION:  0002                         FACILITY:  [facility]   PHYSICIAN:  Adolph Pollack, M.D.DATE OF BIRTH:  [birth year]   DATE OF ADMISSION:  01/23/2008  DATE OF DISCHARGE:                              HISTORY & PHYSICAL   REASON FOR ADMISSION:  Closure, transverse loop colostomy.   HISTORY OF PRESENT ILLNESS:  Ms. [name] is a 55 year old female who  underwent a left salpingo-oophorectomy in July 2008 of a stromal-type  tumor.  This was complicated by wound infection, then a pelvic abscess  which ended up being percutaneously drained, and then she had a fistula  from her sigmoid colon.  Over months, this has eventually closed.  A  transverse loop colostomy was necessary to assist in the closure of the  fistula, and now she presents for closure of the transverse loop  colostomy.  She also has intermittent drainage from the previous drain  site in the left lower quadrant.   PAST MEDICAL HISTORY:  1. Left ovarian stromal tumor.  2. Hypertension.  3. Endometriosis.  4. Pelvic abscess.  5. Sigmoid colon fistula.  6. Acute renal injury secondary to vancomycin nephrotoxicity.   PREVIOUS OPERATIONS:  1. Exploratory laparotomy, lysis of adhesions, left salpingo-      oophorectomy.  2. Appendectomy.  3. Bilateral ovarian cystectomy.  4. Exploratory laparotomy in the past.   ALLERGIES:  VANCOMYCIN HAS LED TO SOME NEPHROTOXICITY IN THE PAST.   MEDICATIONS:  Include Prozac, lisinopril-HCTZ 20/12.5 daily.   SOCIAL HISTORY:  She is single and divorced.  No smoking or alcohol use.   PHYSICAL EXAM:  GENERAL:  Well-developed, well-nourished female in no  acute distress, very pleasant and cooperative.  VITAL SIGNS: Temperature is 97 degrees, blood pressure is 94/72, pulse  97, respiratory rate 20, O2 saturation 98% on room air.  NECK:  Supple  without masses.   RESPIRATORY:  Breath sounds equal and clear,  respirations unlabored.  CARDIOVASCULAR:  Regular rate, regular rhythm, no murmur.  ABDOMEN:  Soft with lower midline scar.  There is a loop colostomy in  the right upper quadrant with a stomal bag.  There is a slightly open,  draining wound in the left lower quadrant of small-caliber.  EXTREMITIES:  Have SCDs on.   IMPRESSION:  1. Transverse loop colostomy - This was to allow upper fistula to      close.  2. Chronic left lower quadrant draining wound.   PLANS:  1. Transverse loop colostomy closure.  2. Left lower quadrant wound exploration and debridement.   Procedure and risks have been discussed with her preoperatively.      Adolph Pollack, M.D.  Electronically Signed     TJR/MEDQ  D:  01/23/2008  T:  01/23/2008  Job:  [id number]

## 2011-02-23 NOTE — H&P (Signed)
NAMEKATARZYNA, [name]                ACCOUNT NO.:  [id number]   MEDICAL RECORD NO.:  [id number]          PATIENT TYPE:  INP   LOCATION:  0005                         FACILITY:  [facility]   PHYSICIAN:  Adolph Pollack, M.D.DATE OF BIRTH:  1956-04-23   DATE OF ADMISSION:  02/22/2008  DATE OF DISCHARGE:                              HISTORY & PHYSICAL   REASON FOR ADMISSION:  Partial colectomy for recurrent colocutaneous  fistula.   HISTORY OF PRESENT ILLNESS:  [name] is a 55 year old female with a  complicated history over the past 9 months.  She underwent an attempted  [facility] for left ovarian tumor, but only had the left salpingo-  oophorectomy done secondary to severe adhesions.  Postoperatively, she  developed a wound infection and a large pelvic abscess.  This was  percutaneously drained, and she was then found to have a colocutaneous  fistula.  After a prolonged period, the fistula resolved after a  diverting transverse loop colostomy and dietary modification.  She was  subsequently admitted April 14 for closure of the transverse loop  colostomy, which she tolerated, but on postoperative day #2, the fistula  re-opened.  She was placed on bowel rest, TNA, and a CT scan, which  demonstrated the fistula as well as a small amount of air  in her  bladder, although she had recently had a Foley and had no obvious  symptoms of a colovesical fistula at the time.  She was sent home on a  liquid diet, TNA, and her protein level improved  She is now brought  back for the above elective surgery.   PAST MEDICAL HISTORY:  1. Left ovarian benign stromal tumor.  2. Hypertension.  3. Endometriosis.  4. Pelvic abscess.  5. Recurrent colocutaneous fistula.  6. Acute renal insufficiency secondary to vancomycin toxicity.   PREVIOUS OPERATIONS:  1. Exploratory laparotomy with lysis of adhesions, left salpingo-      oophorectomy.  2. Appendectomy.  3. Bilateral ovarian cystectomy.  4.  Exploratory laparotomy in the past.  5. Transverse loop colostomy.  6. Loop colostomy closure.   ALLERGIES:  VANCOMYCIN LED TO NEPHROTOXICITY IN THE PAST.   MEDICATIONS:  1. Prozac.  2. Lisinopril/HCTZ.  3. Nasonex.  4. Tylenol.  5. TNA via the PICC line.   SOCIAL HISTORY:  Single, divorced.  No tobacco, alcohol use.   REVIEW OF SYSTEMS:  She is feeling much stronger and better with no  pain.  Has minimal output in the fistula except for the bowel prep.  She  said it was very active.   PHYSICAL EXAMINATION:  Generally, a slightly anxious female in no acute  distress. Very pleasant and cooperative.  VITAL SIGNS:  Temperature 97.4, pulse 103, blood pressure 120/79.  EYES:  Extraocular motions intact.  No icterus.  NECK: Supple without masses.  RESPIRATORY:  Breath sounds equal and clear.  Respirations unlabored.  CARDIOVASCULAR:  Demonstrates a regular rate, regular rhythm.  I hear no  murmur.  ABDOMEN:  Soft, with a well healing transverse scar.  There is a lower  midline white scar as well.  There is also a fistula with a stoma  appliance on it in the left lower quadrant.  MUSCULOSKELETAL:  SCDs on.  SKIN:  No jaundice present.   LABORATORY DATA:  CBC within normal limits.  Albumin is 3.6.   IMPRESSION:  Recurrent colocutaneous fistula.   PLAN:  To the operating room for partial colectomy and debridement of  fistula tract.      Adolph Pollack, M.D.  Electronically Signed     TJR/MEDQ  D:  02/22/2008  T:  02/22/2008  Job:  [id number]

## 2011-02-23 NOTE — Consult Note (Signed)
[name], [name]                ACCOUNT NO.:  [id number]   MEDICAL RECORD NO.:  [id number]          PATIENT TYPE:  INP   LOCATION:  1435                         FACILITY:  [facility]   PHYSICIAN:  Maree Krabbe, M.D.DATE OF BIRTH:  07-Aug-1956   DATE OF CONSULTATION:  DATE OF DISCHARGE:                                 CONSULTATION   REASON FOR CONSULTATION:  Elevated creatinine.   IMPRESSION:  This is a 55 year old white female with:  1.  Acute kidney  injury, most likely due to vancomycin nephrotoxicity related acute  tubular necrosis.  2.  Euvolemic or possibly slightly hypovolemic.  3.  Status post recent left salpingo-oophorectomy complicated by complex  pelvic abscess with sigmoid colon fistula.  4.  History of hypertension,  on angiotensin converting enzyme inhibitor.  Recommendations:  1.  Discontinue angiotensin converting enzyme inhibitor.  2.  Increase  intravenous fluids.  3.  Avoid vancomycin for now and avoid other  nephrotoxins, including intravenous contrast until creatinine is close  to normal.   HISTORY:  The patient is a 55 year old white female who has hypertension  and endometriosis.  She was planned to have a TAH/BSO on May 09, 2007;  however, she had dense adhesions and underwent a left salpingo-  oophorectomy for a left adnexal mass instead.  She was admitted then on  May 26, 2007 with 3-day history of fever, nausea, vomiting, left  lower quadrant pain.  She had a white blood count of 18,000 and pelvic  abscesses on CT scan.  She was admitted to the hospital, put on IV  fluids and IV antibiotics with Zosyn and Flagyl.  She had a CT scan on  May 27, 2007 with IV contrast.  Creatinine remained stable at 0.9.  She had a second CT scan with IV contrast on May 31, 2007 and again,  the creatinine remained stable.  On June 02, 2007 it was 0.9.  On  June 03, 2007 it was 0.9.  She was started on vancomycin for positive  MRSA culture from the abscess  fluid on June 02, 2007.  She was started  on 1250 mg every 12 hours.  On August 23, the creatinine was normal, but  on August 24, bumped up to 2.04.  She did not have any hypotension.  She  has been concomitantly receiving ACE inhibitors by mouth, lisinopril  20mg  daily.  A vancomycin trough level on June 04, 2007 was markedly  elevated at 39.6 and the vancomycin was stopped the next day when the  creatinine had gone up to 2.17.  Since then, the creatinine has  gradually improved down to 2.0 on June 06, 2007, 1.7 on June 08, 2007, 1.7 today, on June 09, 2007.  She has since been started on  Diflucan and changed over to Tygacil.  The Zosyn was stopped on June 05, 2007 and the Flagyl has been stopped also.  She is still getting her  lisinopril.  Her urine output has been good.   Today the patient has no specific complaints.  She is eating solids and  liquids.  They are going to start TNA soon on her.  She is moving her  bowels and voiding without a catheter.   PAST MEDICAL HISTORY:  1. Hypertension.  2. Endometriosis.  3. Left salpingo-oophorectomy in July, 2008 with complications as      above.   MEDICATIONS PRIOR TO ADMISSION:  1. Lisinopril.  2. Prozac.   ALLERGIES:  No drug allergies.   CURRENT MEDICATIONS:  1. Diflucan.  2. Lisinopril 20 q. day.  3. Zofran.  4. Protonix.  5. Tygacil 50 mg q.12h.   ALLERGIES:  None.   REVIEW OF SYSTEMS:  Noncontributory.   FAMILY HISTORY:  Noncontributory.   SOCIAL HISTORY:  No alcohol or drug or tobacco use.   PHYSICAL EXAMINATION:  VITAL SIGNS:  Afebrile, vital signs stable.  Blood pressure 131/78, temperature 98.8, pulse 80, respirations 18, O2  sat 100% on room air.  CHEST:  Clear throughout.  NECK:  Supple, without JVD.  CARDIAC:  Regular rate and rhythm, without murmurs, rubs or gallops.  ABDOMEN:  Soft, nontender.  She has a wound vac on a small wound in the  suprapubic region.  She has one drain in the left  lower quadrant and  another in the right lower quadrant, draining purulent material,  greenish, purulent material.  She has no peripheral edema.  EXTREMITIES:  Normal.  NEUROLOGICAL:  Alert and oriented x 3.   LABS:  Abscess cultured.  Fluid has grown E. coli, another grew multiple  organisms, none predominant, and another grew rare MRSA and candida  albicans.  Sodium 138, potassium 3.9, BUN 19, creatinine 1.7, GFR 32 ml  per minute, calcium 8.2, white blood count 14,000, hemoglobin 8.8,  platelets 450.  I do not see a urinalysis.  Her previous urinalysis on  May 26, 2007 was normal.      Maree Krabbe, M.D.  Electronically Signed     RDS/MEDQ  D:  06/09/2007  T:  06/10/2007  Job:  [id number]

## 2011-02-26 NOTE — Discharge Summary (Signed)
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   SECONDARY DIAGNOSES:  1. Acute diverticulitis.  2. Enterovesical fistula.  3. Postoperative ileus.  4. Acute blood loss anemia.   PROCEDURES:  Exploratory laparotomy with lysis of adhesions, sigmoid  colectomy, abscess drainage, debridement of fistula tract, closure of  enterovesical fistula, bilateral ureteral stents.   REASON FOR ADMISSION:  This is a 55 year old female who had a  colocutaneous fistula that closed but then recurred.  Thus she has  failed nonoperative management and she was admitted for a partial  colectomy.   HOSPITAL COURSE:  She underwent the above procedure, intraoperatively  was found also to have an enterovesical fistula, which was repaired by  Dr. Bjorn Pippin.  Postoperatively she was maintained on IV antibiotics.  Her pathology came back consistent with acute diverticulitis with some  associated abscess.  No malignancy.  She had some acute blood loss  anemia and a postoperative ileus, but over time both these slowly  improved.  Her wound remained clean and intact and the left lower  quadrant wound which had been debrided was clean as well with dressing  changes.  She was asymptomatic from her anemia.  Her TNA was eventually  weaned off and her Foley was removed and she voided well.  By Mar 01, 2008, she was able to be discharged.   DISPOSITION:  Discharged to home Mar 01, 2008.  Staples were removed.  She was given medication for pain and discharge instructions and will  follow up in the office with me in about 3  weeks as well as follow up  with Dr. Annabell Howells in about 3-4 weeks.      Adolph Pollack, M.D.  Electronically Signed     TJR/MEDQ  D:  03/29/2008  T:  03/29/2008  Job:  [id number]   cc:   Nadyne Coombes. Fontaine, M.D.  Fax: [phone]

## 2011-05-27 ENCOUNTER — Encounter: Payer: Self-pay | Admitting: Gynecology

## 2011-07-06 LAB — DIFFERENTIAL
Basophils Absolute: 0
Basophils Absolute: 0
Basophils Relative: 0
Basophils Relative: 0
Eosinophils Absolute: 0.1
Eosinophils Absolute: 0.1
Eosinophils Relative: 5
Lymphocytes Relative: 14
Lymphs Abs: 0.8
Monocytes Absolute: 0.6
Monocytes Relative: 5
Neutro Abs: 6.7
Neutro Abs: 4.1
Neutrophils Relative %: 82 — ABNORMAL HIGH
Neutrophils Relative %: 80 — ABNORMAL HIGH

## 2011-07-06 LAB — COMPREHENSIVE METABOLIC PANEL
ALT: 26
ALT: 12
ALT: 12
AST: 27
AST: 43 — ABNORMAL HIGH
Albumin: 2.7 — ABNORMAL LOW
Albumin: 2.9 — ABNORMAL LOW
Albumin: 3.1 — ABNORMAL LOW
Alkaline Phosphatase: 82
Alkaline Phosphatase: 57
BUN: 14
BUN: 14
BUN: 28 — ABNORMAL HIGH
BUN: 3 — ABNORMAL LOW
CO2: 27
CO2: 29
Calcium: 8.3 — ABNORMAL LOW
Calcium: 8.4
Calcium: 9.2
Chloride: 101
Creatinine, Ser: 0.63
Creatinine, Ser: 0.71
GFR calc Af Amer: >60
GFR calc Af Amer: >60
GFR calc Af Amer: >60
GFR calc non Af Amer: >60
GFR calc non Af Amer: >60
Glucose, Bld: 113 — ABNORMAL HIGH
Glucose, Bld: 124 — ABNORMAL HIGH
Glucose, Bld: 89
Potassium: 3.4 — ABNORMAL LOW
Potassium: 3.4 — ABNORMAL LOW
Potassium: 4.5
Sodium: 136
Sodium: 133 — ABNORMAL LOW
Sodium: 133 — ABNORMAL LOW
Total Bilirubin: 0.8
Total Bilirubin: 0.7
Total Protein: 5.3 — ABNORMAL LOW
Total Protein: 6
Total Protein: 6.2

## 2011-07-06 LAB — CBC
HCT: 37.7
HCT: 29.3 — ABNORMAL LOW
HCT: 31.6 — ABNORMAL LOW
Hemoglobin: 13.5
Hemoglobin: 10.3 — ABNORMAL LOW
MCHC: 35
MCHC: 35.2
MCV: 91.9
Platelets: 311
RBC: 4.18
RBC: 3.19 — ABNORMAL LOW
RDW: 12.5
WBC: 5.9

## 2011-07-06 LAB — CHOLESTEROL, TOTAL: Cholesterol: 182

## 2011-07-06 LAB — PREALBUMIN: Prealbumin: 8 — ABNORMAL LOW

## 2011-07-06 LAB — PHOSPHORUS
Phosphorus: 3.9
Phosphorus: 5.3 — ABNORMAL HIGH

## 2011-07-06 LAB — BASIC METABOLIC PANEL
BUN: 11
BUN: 22
CO2: 28
Calcium: 9.1
Chloride: 104
Creatinine, Ser: 0.7
GFR calc non Af Amer: >60
Glucose, Bld: 162 — ABNORMAL HIGH
Glucose, Bld: 88
Potassium: 4.1
Sodium: 134 — ABNORMAL LOW

## 2011-07-06 LAB — MAGNESIUM
Magnesium: 1.7
Magnesium: 1.9

## 2011-07-06 LAB — TRIGLYCERIDES: Triglycerides: 106

## 2011-07-07 LAB — COMPREHENSIVE METABOLIC PANEL
AST: 22
Albumin: 2.1 — ABNORMAL LOW
Albumin: 2.4 — ABNORMAL LOW
Alkaline Phosphatase: 78
BUN: 22
BUN: 6
BUN: 6
BUN: 8
CO2: 28
CO2: 30
Calcium: 8.4
Chloride: 101
Chloride: 104
Creatinine, Ser: 0.57
Creatinine, Ser: 0.67
Creatinine, Ser: 0.75
Creatinine, Ser: 0.77
GFR calc Af Amer: >60
GFR calc non Af Amer: >60
GFR calc non Af Amer: >60
GFR calc non Af Amer: >60
Glucose, Bld: 116 — ABNORMAL HIGH
Glucose, Bld: 134 — ABNORMAL HIGH
Glucose, Bld: 97
Potassium: 4.4
Total Bilirubin: 0.5
Total Bilirubin: 0.7
Total Bilirubin: 0.7
Total Protein: 4.4 — ABNORMAL LOW

## 2011-07-07 LAB — BASIC METABOLIC PANEL
BUN: 14
BUN: 21
BUN: 7
CO2: 26
CO2: 30
Chloride: 100
Chloride: 102
Creatinine, Ser: 0.6
Creatinine, Ser: 0.79
GFR calc Af Amer: >60
GFR calc Af Amer: >60
GFR calc non Af Amer: >60
GFR calc non Af Amer: >60
GFR calc non Af Amer: >60
Glucose, Bld: 107 — ABNORMAL HIGH
Glucose, Bld: 110 — ABNORMAL HIGH
Potassium: 3.5
Potassium: 3.9
Potassium: 4.3
Sodium: 137

## 2011-07-07 LAB — TRIGLYCERIDES: Triglycerides: 88

## 2011-07-07 LAB — MAGNESIUM
Magnesium: 1.7
Magnesium: 1.7
Magnesium: 1.8
Magnesium: 2

## 2011-07-07 LAB — CBC
HCT: 24 — ABNORMAL LOW
HCT: 25.3 — ABNORMAL LOW
HCT: 26.3 — ABNORMAL LOW
HCT: 27.1 — ABNORMAL LOW
Hemoglobin: 8.8 — ABNORMAL LOW
Hemoglobin: 9 — ABNORMAL LOW
MCHC: 34.2
MCHC: 34.9
MCV: 92.2
MCV: 92.3
MCV: 92.6
Platelets: 195
Platelets: 198
RBC: 2.75 — ABNORMAL LOW
RBC: 2.84 — ABNORMAL LOW
RDW: 12.6
RDW: 12.8
WBC: 11.2 — ABNORMAL HIGH

## 2011-07-07 LAB — DIFFERENTIAL
Basophils Absolute: 0
Lymphocytes Relative: 10 — ABNORMAL LOW
Lymphocytes Relative: 12
Lymphs Abs: 0.9
Monocytes Absolute: 0.7
Monocytes Relative: 9
Neutro Abs: 5.7
Neutrophils Relative %: 75

## 2011-07-07 LAB — CHOLESTEROL, TOTAL: Cholesterol: 132

## 2011-07-07 LAB — CARDIAC PANEL(CRET KIN+CKTOT+MB+TROPI)
Relative Index: 0.2
Troponin I: <0.01

## 2011-07-07 LAB — PHOSPHORUS
Phosphorus: 3.3
Phosphorus: 3.7
Phosphorus: 4.6

## 2011-07-07 LAB — PREALBUMIN: Prealbumin: 9.4 — ABNORMAL LOW

## 2011-07-22 LAB — MAGNESIUM: Magnesium: 1.9

## 2011-07-22 LAB — CBC
HCT: 30 — ABNORMAL LOW
HCT: 30.6 — ABNORMAL LOW
HCT: 30.8 — ABNORMAL LOW
Hemoglobin: 10.7 — ABNORMAL LOW
MCHC: 34.7
MCHC: 34.8
MCHC: 34.9
MCV: 83.8
MCV: 84.1
MCV: 84.4
Platelets: 490 — ABNORMAL HIGH
RBC: 3.58 — ABNORMAL LOW
RBC: 3.65 — ABNORMAL LOW
RDW: 16.3 — ABNORMAL HIGH
RDW: 16.4 — ABNORMAL HIGH
WBC: 8.7

## 2011-07-22 LAB — BASIC METABOLIC PANEL
CO2: 26
Calcium: 9
Creatinine, Ser: 0.83
GFR calc Af Amer: >60
Glucose, Bld: 99

## 2011-07-22 LAB — DIFFERENTIAL
Basophils Absolute: 0.1
Basophils Relative: 0
Eosinophils Absolute: 0.5
Eosinophils Absolute: 0.9 — ABNORMAL HIGH
Eosinophils Absolute: 1.1 — ABNORMAL HIGH
Eosinophils Relative: 16 — ABNORMAL HIGH
Eosinophils Relative: 7 — ABNORMAL HIGH
Lymphocytes Relative: 14
Lymphocytes Relative: 7 — ABNORMAL LOW
Lymphs Abs: 0.6 — ABNORMAL LOW
Lymphs Abs: 1.2
Lymphs Abs: 1.2
Monocytes Absolute: 0.6
Monocytes Absolute: 0.6
Monocytes Relative: 7
Monocytes Relative: 8
Monocytes Relative: 9
Neutro Abs: 6.7
Neutrophils Relative %: 63
Neutrophils Relative %: 76

## 2011-07-22 LAB — COMPREHENSIVE METABOLIC PANEL
ALT: 48 — ABNORMAL HIGH
AST: 25
AST: 29
Albumin: 2.5 — ABNORMAL LOW
BUN: 22
Calcium: 8.5
Calcium: 8.6
Chloride: 105
Creatinine, Ser: 0.86
GFR calc Af Amer: >60
GFR calc Af Amer: >60
Sodium: 138
Total Bilirubin: 0.5
Total Protein: 5.9 — ABNORMAL LOW

## 2011-07-22 LAB — PHOSPHORUS: Phosphorus: 3.8

## 2011-07-22 LAB — PREALBUMIN: Prealbumin: 32.5

## 2011-07-22 LAB — INHIBIN A: Inhibin-A: <1

## 2011-07-23 LAB — PHOSPHORUS
Phosphorus: 2.4
Phosphorus: 3
Phosphorus: 3.5
Phosphorus: 4.3

## 2011-07-23 LAB — RENAL FUNCTION PANEL
Calcium: 7.7 — ABNORMAL LOW
GFR calc Af Amer: 47 — ABNORMAL LOW
GFR calc non Af Amer: 39 — ABNORMAL LOW
Glucose, Bld: 113 — ABNORMAL HIGH
Phosphorus: 4.6
Sodium: 132 — ABNORMAL LOW

## 2011-07-23 LAB — BASIC METABOLIC PANEL
BUN: 22
BUN: 29 — ABNORMAL HIGH
BUN: 18
BUN: 22
BUN: 3 — ABNORMAL LOW
BUN: 3 — ABNORMAL LOW
BUN: 5 — ABNORMAL LOW
BUN: 6
BUN: 6
CO2: 27
CO2: 27
CO2: 28
CO2: 29
CO2: 24
CO2: 25
CO2: 26
CO2: 26
CO2: 27
CO2: 28
CO2: 28
Calcium: 8 — ABNORMAL LOW
Calcium: 8.1 — ABNORMAL LOW
Calcium: 8.1 — ABNORMAL LOW
Calcium: 8.1 — ABNORMAL LOW
Calcium: 8.1 — ABNORMAL LOW
Calcium: 8.2 — ABNORMAL LOW
Calcium: 8.2 — ABNORMAL LOW
Calcium: 8.2 — ABNORMAL LOW
Calcium: 8.2 — ABNORMAL LOW
Calcium: 8.5
Chloride: 100
Chloride: 101
Chloride: 102
Chloride: 102
Chloride: 104
Chloride: 100
Chloride: 102
Chloride: 108
Creatinine, Ser: 1.23 — ABNORMAL HIGH
Creatinine, Ser: 1.34 — ABNORMAL HIGH
Creatinine, Ser: 1.48 — ABNORMAL HIGH
Creatinine, Ser: 0.69
Creatinine, Ser: 0.72
Creatinine, Ser: 0.82
Creatinine, Ser: 1.61 — ABNORMAL HIGH
Creatinine, Ser: 1.95 — ABNORMAL HIGH
GFR calc Af Amer: 45 — ABNORMAL LOW
GFR calc Af Amer: 49 — ABNORMAL LOW
GFR calc Af Amer: >60
GFR calc Af Amer: >60
GFR calc Af Amer: 31 — ABNORMAL LOW
GFR calc Af Amer: 38 — ABNORMAL LOW
GFR calc Af Amer: 41 — ABNORMAL LOW
GFR calc Af Amer: >60
GFR calc Af Amer: >60
GFR calc Af Amer: >60
GFR calc non Af Amer: 37 — ABNORMAL LOW
GFR calc non Af Amer: 46 — ABNORMAL LOW
GFR calc non Af Amer: 24 — ABNORMAL LOW
GFR calc non Af Amer: 26 — ABNORMAL LOW
GFR calc non Af Amer: 26 — ABNORMAL LOW
GFR calc non Af Amer: 27 — ABNORMAL LOW
GFR calc non Af Amer: 34 — ABNORMAL LOW
GFR calc non Af Amer: >60
GFR calc non Af Amer: >60
GFR calc non Af Amer: >60
GFR calc non Af Amer: >60
GFR calc non Af Amer: >60
GFR calc non Af Amer: >60
Glucose, Bld: 102 — ABNORMAL HIGH
Glucose, Bld: 109 — ABNORMAL HIGH
Glucose, Bld: 119 — ABNORMAL HIGH
Glucose, Bld: 107 — ABNORMAL HIGH
Glucose, Bld: 117 — ABNORMAL HIGH
Glucose, Bld: 123 — ABNORMAL HIGH
Glucose, Bld: 127 — ABNORMAL HIGH
Glucose, Bld: 96
Glucose, Bld: 97
Glucose, Bld: 98
Glucose, Bld: 98
Potassium: 3.7
Potassium: 3.9
Potassium: 4
Potassium: 4.1
Potassium: 3.5
Potassium: 3.7
Potassium: 3.8
Potassium: 3.8
Potassium: 4
Potassium: 4.2
Sodium: 139
Sodium: 133 — ABNORMAL LOW
Sodium: 133 — ABNORMAL LOW
Sodium: 133 — ABNORMAL LOW
Sodium: 134 — ABNORMAL LOW
Sodium: 135
Sodium: 135
Sodium: 135
Sodium: 137
Sodium: 138
Sodium: 141

## 2011-07-23 LAB — CROSSMATCH
ABO/RH(D): B POS
Antibody Screen: NEGATIVE

## 2011-07-23 LAB — HEPATIC FUNCTION PANEL
Alkaline Phosphatase: 68
Bilirubin, Direct: 0.1
Indirect Bilirubin: 0.6
Total Bilirubin: 0.7
Total Protein: 4.8 — ABNORMAL LOW

## 2011-07-23 LAB — DIFFERENTIAL
Basophils Absolute: 0
Basophils Relative: 1
Eosinophils Absolute: 0.1
Eosinophils Absolute: 0.1
Eosinophils Relative: 1
Eosinophils Relative: 1
Lymphocytes Relative: 10 — ABNORMAL LOW
Lymphocytes Relative: 7 — ABNORMAL LOW
Lymphocytes Relative: 6 — ABNORMAL LOW
Lymphocytes Relative: 9 — ABNORMAL LOW
Lymphs Abs: 0.9
Lymphs Abs: 1.3
Lymphs Abs: 0.7
Lymphs Abs: 0.8
Monocytes Relative: 8
Monocytes Relative: 6
Monocytes Relative: 8
Neutro Abs: 10.5 — ABNORMAL HIGH
Neutro Abs: 11.4 — ABNORMAL HIGH
Neutrophils Relative %: 86 — ABNORMAL HIGH
Neutrophils Relative %: 87 — ABNORMAL HIGH

## 2011-07-23 LAB — COMPREHENSIVE METABOLIC PANEL
ALT: 12
ALT: 8
ALT: 6
AST: 12
AST: 16
AST: 29
AST: 31
AST: 11
AST: 29
Albumin: 1.6 — ABNORMAL LOW
Alkaline Phosphatase: 94
Alkaline Phosphatase: 86
BUN: 28 — ABNORMAL HIGH
BUN: 31 — ABNORMAL HIGH
BUN: 12
CO2: 25
CO2: 25
CO2: 26
CO2: 31
Calcium: 7.9 — ABNORMAL LOW
Calcium: 8.1 — ABNORMAL LOW
Calcium: 8.1 — ABNORMAL LOW
Calcium: 8.3 — ABNORMAL LOW
Chloride: 103
Chloride: 103
Chloride: 90 — ABNORMAL LOW
Creatinine, Ser: 1.09
Creatinine, Ser: 1.36 — ABNORMAL HIGH
Creatinine, Ser: 1.58 — ABNORMAL HIGH
Creatinine, Ser: 0.74
GFR calc Af Amer: 42 — ABNORMAL LOW
GFR calc Af Amer: 50 — ABNORMAL LOW
GFR calc Af Amer: >60
GFR calc Af Amer: >60
GFR calc non Af Amer: 41 — ABNORMAL LOW
GFR calc non Af Amer: 53 — ABNORMAL LOW
GFR calc non Af Amer: >60
Glucose, Bld: 104 — ABNORMAL HIGH
Glucose, Bld: 120 — ABNORMAL HIGH
Potassium: 4.2
Potassium: 3.7
Sodium: 136
Sodium: 137
Sodium: 137
Total Bilirubin: 0.5
Total Bilirubin: 0.6
Total Bilirubin: 0.6
Total Protein: 4.9 — ABNORMAL LOW
Total Protein: 5.5 — ABNORMAL LOW
Total Protein: 5 — ABNORMAL LOW

## 2011-07-23 LAB — CBC
HCT: 28.6 — ABNORMAL LOW
HCT: 29.4 — ABNORMAL LOW
HCT: 29.5 — ABNORMAL LOW
HCT: 29.7 — ABNORMAL LOW
HCT: 25.4 — ABNORMAL LOW
HCT: 26.9 — ABNORMAL LOW
HCT: 27.2 — ABNORMAL LOW
HCT: 28.6 — ABNORMAL LOW
HCT: 29.6 — ABNORMAL LOW
HCT: 30.5 — ABNORMAL LOW
HCT: 31.2 — ABNORMAL LOW
Hemoglobin: 10.1 — ABNORMAL LOW
Hemoglobin: 9.9 — ABNORMAL LOW
Hemoglobin: 10 — ABNORMAL LOW
Hemoglobin: 10.2 — ABNORMAL LOW
Hemoglobin: 10.9 — ABNORMAL LOW
Hemoglobin: 8.4 — ABNORMAL LOW
Hemoglobin: 8.8 — ABNORMAL LOW
Hemoglobin: 8.9 — ABNORMAL LOW
Hemoglobin: 8.9 — ABNORMAL LOW
Hemoglobin: 9.2 — ABNORMAL LOW
Hemoglobin: 9.3 — ABNORMAL LOW
Hemoglobin: 9.4 — ABNORMAL LOW
MCHC: 34.3
MCHC: 34.3
MCHC: 34.4
MCHC: 34.6
MCHC: 34.5
MCHC: 34.6
MCHC: 34.7
MCHC: 34.7
MCHC: 35
MCV: 84.6
MCV: 84.7
MCV: 88.3
MCV: 88.4
MCV: 88.4
MCV: 88.8
MCV: 89.6
MCV: 89.8
MCV: 90
MCV: 90.1
MCV: 90.2
MCV: 91.8
Platelets: 338
Platelets: 342
Platelets: 347
Platelets: 356
Platelets: 377
Platelets: 385
Platelets: 440 — ABNORMAL HIGH
Platelets: 408 — ABNORMAL HIGH
Platelets: 498 — ABNORMAL HIGH
Platelets: 592 — ABNORMAL HIGH
Platelets: 649 — ABNORMAL HIGH
Platelets: 664 — ABNORMAL HIGH
Platelets: 685 — ABNORMAL HIGH
Platelets: 693 — ABNORMAL HIGH
Platelets: 741 — ABNORMAL HIGH
RBC: 2.49 — ABNORMAL LOW
RBC: 3.48 — ABNORMAL LOW
RBC: 3.49 — ABNORMAL LOW
RBC: 3.56 — ABNORMAL LOW
RBC: 2.68 — ABNORMAL LOW
RBC: 2.81 — ABNORMAL LOW
RBC: 2.85 — ABNORMAL LOW
RBC: 2.96 — ABNORMAL LOW
RBC: 3.05 — ABNORMAL LOW
RBC: 3.2 — ABNORMAL LOW
RBC: 3.4 — ABNORMAL LOW
RDW: 13.6
RDW: 14.1 — ABNORMAL HIGH
RDW: 15.8 — ABNORMAL HIGH
RDW: 12.8
RDW: 12.9
RDW: 12.9
RDW: 13
RDW: 13.3
RDW: 13.3
RDW: 13.3
RDW: 13.3
RDW: 13.4
RDW: 13.4
RDW: 13.5
WBC: 10.8 — ABNORMAL HIGH
WBC: 11.7 — ABNORMAL HIGH
WBC: 12 — ABNORMAL HIGH
WBC: 13.1 — ABNORMAL HIGH
WBC: 13.7 — ABNORMAL HIGH
WBC: 14.1 — ABNORMAL HIGH
WBC: 11.4 — ABNORMAL HIGH
WBC: 13.3 — ABNORMAL HIGH
WBC: 14 — ABNORMAL HIGH
WBC: 14.1 — ABNORMAL HIGH
WBC: 14.3 — ABNORMAL HIGH
WBC: 18.1 — ABNORMAL HIGH

## 2011-07-23 LAB — PREALBUMIN
Prealbumin: 4.1 — ABNORMAL LOW
Prealbumin: 4.7 — ABNORMAL LOW
Prealbumin: 7.5 — ABNORMAL LOW
Prealbumin: 4.8 — ABNORMAL LOW

## 2011-07-23 LAB — URINALYSIS, ROUTINE W REFLEX MICROSCOPIC
Bilirubin Urine: NEGATIVE
Glucose, UA: 100 — AB
Hgb urine dipstick: NEGATIVE
Ketones, ur: NEGATIVE
Protein, ur: NEGATIVE
Protein, ur: NEGATIVE
Urobilinogen, UA: 0.2

## 2011-07-23 LAB — CULTURE, ROUTINE-ABSCESS: Gram Stain: NONE SEEN

## 2011-07-23 LAB — TRIGLYCERIDES
Triglycerides: 141
Triglycerides: 151 — ABNORMAL HIGH
Triglycerides: 79
Triglycerides: 65

## 2011-07-23 LAB — ANAEROBIC CULTURE

## 2011-07-23 LAB — MAGNESIUM
Magnesium: 1.7
Magnesium: 1.8
Magnesium: 1.9
Magnesium: 2.1

## 2011-07-23 LAB — CHOLESTEROL, TOTAL
Cholesterol: 61
Cholesterol: 63

## 2011-07-23 LAB — PROTIME-INR: Prothrombin Time: 13.6

## 2011-07-26 LAB — CBC
HCT: 36.3
HCT: 40.6
Hemoglobin: 14.1
MCHC: 34.4
MCHC: 34.8
MCV: 89.8
MCV: 93
Platelets: 356
RBC: 4.52
WBC: 21 — ABNORMAL HIGH
WBC: 7

## 2011-07-26 LAB — BASIC METABOLIC PANEL
BUN: 10
CO2: 28
Chloride: 99
Creatinine, Ser: 0.74
Glucose, Bld: 100 — ABNORMAL HIGH
Potassium: 3.9

## 2011-07-26 LAB — HCG, SERUM, QUALITATIVE: Preg, Serum: NEGATIVE

## 2012-01-12 ENCOUNTER — Other Ambulatory Visit (HOSPITAL_COMMUNITY)
Admission: RE | Admit: 2012-01-12 | Discharge: 2012-01-12 | Disposition: A | Discharge status: Home / Self Care | Payer: BC Managed Care – PPO | Source: Ambulatory Visit | Attending: Gynecology | Admitting: Gynecology

## 2012-01-13 ENCOUNTER — Ambulatory Visit (INDEPENDENT_AMBULATORY_CARE_PROVIDER_SITE_OTHER): Payer: BC Managed Care – PPO | Admitting: Gynecology

## 2012-01-13 ENCOUNTER — Encounter: Payer: Self-pay | Admitting: Gynecology

## 2012-01-13 VITALS — BP 124/84 | Ht 63.5 in | Wt 186.0 lb

## 2012-01-13 DIAGNOSIS — Z01419 Encounter for gynecological examination (general) (routine) without abnormal findings: Secondary | ICD-10-CM

## 2012-01-13 DIAGNOSIS — D3910 Neoplasm of uncertain behavior of unspecified ovary: Secondary | ICD-10-CM

## 2012-01-13 NOTE — Patient Instructions (Signed)
Office will contact you to arrange CT scan.

## 2012-01-13 NOTE — Progress Notes (Signed)
[name] Aug 13, 1956 [id number]        56 y.o.  for annual exam.  Has not been in the office for a number of years and has a very complex history.she underwentexploratory laparotomy with left salpingo-oophorectomy in July 2008 for a symptomatic left adnexal mass. She does have a history of endometriosis and at the time of surgery extensive adhesions were encountered and we did a left salpingo-oophorectomy but no other surgery. 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  Her postoperative course was significant in that she ultimately developed a wound breakdown and infection and intra-peritoneal abscesses where she was felt to have diverticulitis which ended up forming a fistula and abscess formation. She underwent a temporary colostomy. On follow up surgery she was also found to have a enterovesical fistula all of which had been repaired and her colostomy reconnected. She has done well since then and presents now for an annual exam without complaints. She had seen Dr. De Blanch in consultation because of the diagnoses and he felt that no further surgery was needed but did recommend CT scans and follow up and inhibin marker blood work. Patient has failed to follow up for these until now.  Past medical history,surgical history, medications, allergies, family history and social history were all reviewed and documented in the EPIC chart. ROS:  Was performed and pertinent positives and negatives are included in the history.  Exam: [name] chaperone present Filed Vitals:   01/13/12 1106  BP: 124/84   General appearance  Normal Skin grossly normal Head/Neck normal with no cervical or supraclavicular adenopathy thyroid normal Lungs  clear Cardiac RR, without RMG Abdominal  soft, nontender, without masses, organomegaly or hernia multiple well-healed scars Breasts  examined lying and sitting without masses, retractions,  discharge or axillary adenopathy. Pelvic  Ext/BUS/vagina  normal   Cervix  normal  Pap done  Uterus  anteverted, normal size, shape and contour, midline and mobile nontender   Adnexa  Without masses or tenderness    Anus and perineum  normal   Rectovaginal  normal sphincter tone without palpated masses or tenderness.    Assessment/Plan:  56 y.o. female for annual exam.    1. 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 Extensive abdominal adhesions. Was recommended for CT follow up but did not do this and inhibin levels. I ordered the inhibin today and we'll plan on CT scan. 2. Postmenopausal. Patient is doing well from a symptom standpoint and will continue to monitor. 3. Pap smear. Patient has not had a Pap smear in several years I did one today. She has no history of significant abnormal Pap smears before. 4. Mammography. Patient is up-to-date with her mammogram is due and not just a reminder to schedule this. SBE monthly reviewed. 5. Bone health. Increase calcium vitamin D reviewed. Patient's not had a bone density and we'll plan another couple years. 6. Colonoscopy. Patient has not had a colonoscopy she said for about 5 years which would be around the time of her surgery. I recommended she go in schedule and now given her history of diverticulitis and she understands our recommendation. 7. Health maintenance. She sees Dr. Sigmund Hazel routinely who does her routine blood work and the blood work was done today other than her inhibin levels.     Dara Lords MD, 4:40 PM 01/13/2012

## 2012-01-20 ENCOUNTER — Other Ambulatory Visit: Payer: Self-pay | Admitting: *Deleted

## 2012-01-20 ENCOUNTER — Telehealth: Payer: Self-pay | Admitting: *Deleted

## 2012-01-20 NOTE — Telephone Encounter (Signed)
Lm for [name] (Dr. Kemper Durie Pearson's Nurse) that patient needed to be scheduled for a CT that patient needed to do with their office as a follow up and never did.  Told her patient is ready to schedule and call me if any other questions.

## 2012-01-20 NOTE — Telephone Encounter (Signed)
Message copied by Alania Overholt L on Thu Jan 20, 2012  3:21 PM ------      Message from: Colin Broach P      Created: Mon Jan 17, 2012  9:18 AM       Patient needs abdominal/pelvic CT scan with and without contrast RE history of left salpingo-oophorectomy for gonadal stromal tumor of indeterminate type, had endometriosis, diverticulitis with subsequent fistula formation, abdominal abscess, enterovesical fistula 2008. Recommended follow up by a gynecologic oncologist for CT scan due to the left ovarian tumor history.

## 2012-02-16 ENCOUNTER — Other Ambulatory Visit: Payer: Self-pay | Admitting: *Deleted

## 2012-02-16 DIAGNOSIS — Z87898 Personal history of other specified conditions: Secondary | ICD-10-CM

## 2012-02-17 NOTE — Telephone Encounter (Signed)
We scheduled CT at [facility] on 02/22/12 @ 10:30. Auth [id number] for 60 days. Patient informed.

## 2012-02-22 ENCOUNTER — Ambulatory Visit (HOSPITAL_COMMUNITY)
Admission: RE | Admit: 2012-02-22 | Discharge: 2012-02-22 | Disposition: A | Discharge status: Home / Self Care | Payer: BC Managed Care – PPO | Source: Ambulatory Visit | Attending: Gynecology | Admitting: Gynecology

## 2012-02-22 DIAGNOSIS — D4959 Neoplasm of unspecified behavior of other genitourinary organ: Secondary | ICD-10-CM | POA: Insufficient documentation

## 2012-02-22 DIAGNOSIS — K7689 Other specified diseases of liver: Secondary | ICD-10-CM | POA: Insufficient documentation

## 2012-02-22 DIAGNOSIS — Z87898 Personal history of other specified conditions: Secondary | ICD-10-CM

## 2012-02-22 MED ORDER — IOHEXOL 300 MG/ML  SOLN
100.0000 mL | Freq: Once | INTRAMUSCULAR | Status: AC | PRN
  Administered 2012-02-22: 100 mL via INTRAVENOUS

## 2013-10-16 ENCOUNTER — Encounter: Payer: Self-pay | Admitting: Gynecology

## 2014-08-12 ENCOUNTER — Encounter: Payer: Self-pay | Admitting: Gynecology

## 2014-11-20 ENCOUNTER — Encounter: Payer: Self-pay | Admitting: Gynecology

## 2015-12-25 ENCOUNTER — Encounter: Payer: Self-pay | Admitting: Gynecology

## 2016-01-28 DIAGNOSIS — D2112 Benign neoplasm of connective and other soft tissue of left upper limb, including shoulder: Secondary | ICD-10-CM | POA: Diagnosis not present

## 2016-01-28 DIAGNOSIS — M19042 Primary osteoarthritis, left hand: Secondary | ICD-10-CM | POA: Insufficient documentation

## 2016-10-23 DIAGNOSIS — A084 Viral intestinal infection, unspecified: Secondary | ICD-10-CM | POA: Diagnosis not present

## 2016-11-05 DIAGNOSIS — K5792 Diverticulitis of intestine, part unspecified, without perforation or abscess without bleeding: Secondary | ICD-10-CM | POA: Diagnosis not present

## 2017-01-19 DIAGNOSIS — Z1231 Encounter for screening mammogram for malignant neoplasm of breast: Secondary | ICD-10-CM | POA: Diagnosis not present

## 2017-12-14 DIAGNOSIS — N951 Menopausal and female climacteric states: Secondary | ICD-10-CM | POA: Diagnosis not present

## 2017-12-14 DIAGNOSIS — E785 Hyperlipidemia, unspecified: Secondary | ICD-10-CM | POA: Diagnosis not present

## 2017-12-14 DIAGNOSIS — I10 Essential (primary) hypertension: Secondary | ICD-10-CM | POA: Diagnosis not present

## 2018-01-20 DIAGNOSIS — Z1231 Encounter for screening mammogram for malignant neoplasm of breast: Secondary | ICD-10-CM | POA: Diagnosis not present

## 2018-12-20 DIAGNOSIS — Z6833 Body mass index (BMI) 33.0-33.9, adult: Secondary | ICD-10-CM | POA: Diagnosis not present

## 2018-12-20 DIAGNOSIS — J309 Allergic rhinitis, unspecified: Secondary | ICD-10-CM | POA: Diagnosis not present

## 2018-12-20 DIAGNOSIS — J988 Other specified respiratory disorders: Secondary | ICD-10-CM | POA: Diagnosis not present

## 2019-01-30 DIAGNOSIS — I10 Essential (primary) hypertension: Secondary | ICD-10-CM | POA: Diagnosis not present

## 2019-01-30 DIAGNOSIS — M19049 Primary osteoarthritis, unspecified hand: Secondary | ICD-10-CM | POA: Diagnosis not present

## 2019-01-30 DIAGNOSIS — E785 Hyperlipidemia, unspecified: Secondary | ICD-10-CM | POA: Diagnosis not present

## 2019-01-30 DIAGNOSIS — M1990 Unspecified osteoarthritis, unspecified site: Secondary | ICD-10-CM | POA: Diagnosis not present

## 2019-01-31 DIAGNOSIS — I10 Essential (primary) hypertension: Secondary | ICD-10-CM | POA: Diagnosis not present

## 2019-01-31 DIAGNOSIS — E785 Hyperlipidemia, unspecified: Secondary | ICD-10-CM | POA: Diagnosis not present

## 2019-02-11 DIAGNOSIS — Z1212 Encounter for screening for malignant neoplasm of rectum: Secondary | ICD-10-CM | POA: Diagnosis not present

## 2019-02-11 DIAGNOSIS — Z1211 Encounter for screening for malignant neoplasm of colon: Secondary | ICD-10-CM | POA: Diagnosis not present

## 2019-09-11 DIAGNOSIS — H8110 Benign paroxysmal vertigo, unspecified ear: Secondary | ICD-10-CM | POA: Diagnosis not present

## 2019-11-28 DIAGNOSIS — Z1231 Encounter for screening mammogram for malignant neoplasm of breast: Secondary | ICD-10-CM | POA: Diagnosis not present

## 2020-02-25 ENCOUNTER — Other Ambulatory Visit: Payer: Self-pay

## 2020-02-25 ENCOUNTER — Ambulatory Visit (INDEPENDENT_AMBULATORY_CARE_PROVIDER_SITE_OTHER): Payer: Self-pay

## 2020-02-25 ENCOUNTER — Ambulatory Visit (INDEPENDENT_AMBULATORY_CARE_PROVIDER_SITE_OTHER): Payer: Self-pay | Admitting: Podiatry

## 2020-02-25 DIAGNOSIS — M21611 Bunion of right foot: Secondary | ICD-10-CM

## 2020-02-25 DIAGNOSIS — M21612 Bunion of left foot: Secondary | ICD-10-CM

## 2020-02-25 DIAGNOSIS — M779 Enthesopathy, unspecified: Secondary | ICD-10-CM

## 2020-02-25 DIAGNOSIS — M205X9 Other deformities of toe(s) (acquired), unspecified foot: Secondary | ICD-10-CM

## 2020-03-03 NOTE — Progress Notes (Signed)
Subjective:   Patient ID: [name], female   DOB: 64 y.o.   MRN: [id number]   HPI 64 year old female presents the office today for concerns of bunions to both of her feet with the right side worse than left.  She thinks that she had arthritis as well.  She tried ibuprofen.  She also states that she has a flatfoot.  She denies any recent injury or trauma to her feet and she denies any weakness or falls.   Review of Systems  All other systems reviewed and are negative.  Past Medical History:  Diagnosis Date  . High cholesterol   . Hypertension     Past Surgical History:  Procedure Laterality Date  . APPENDECTOMY    . PELVIC LAPAROSCOPY     1- bilateral cystectomy 2- LSO     Current Outpatient Medications:  .  calcium carbonate (OS-CAL) 600 MG TABS, Take 600 mg by mouth 2 (two) times daily with a meal., Disp: , Rfl:  .  cholecalciferol (VITAMIN D) 1000 UNITS tablet, Take 1,000 Units by mouth daily., Disp: , Rfl:  .  FLUoxetine (PROZAC) 20 MG capsule, Take 20 mg by mouth daily., Disp: , Rfl:  .  lisinopril (PRINIVIL,ZESTRIL) 20 MG tablet, Take 20 mg by mouth daily., Disp: , Rfl:  .  Probiotic Product (PROBIOTIC PO), Take by mouth., Disp: , Rfl:  .  simvastatin (ZOCOR) 20 MG tablet, Take 20 mg by mouth every evening., Disp: , Rfl:   Allergies  Allergen Reactions  . Sulfamethoxazole Anaphylaxis  . Sulfa Antibiotics          Objective:  Physical Exam  General: AAO x3, NAD  Dermatological: Skin is warm, dry and supple bilateral. Nails x 10 are well manicured; remaining integument appears unremarkable at this time. There are no open sores, no preulcerative lesions, no rash or signs of infection present.  Vascular: Dorsalis Pedis artery and Posterior Tibial artery pedal pulses are 2/4 bilateral with immedate capillary fill time. Pedal hair growth present.  There is no pain with calf compression, swelling, warmth, erythema.   Neruologic: Grossly intact via light touch  bilateral.   Musculoskeletal: Bilateral bunion deformities are present there is decreased range of motion of first MPJs right side is worse than left.  Crepitation of first MPJ range of motion.  There is no other areas of discomfort.  There is minimal edema to the first which is hemostatic and erythema or warmth.  Gait: Unassisted, Nonantalgic.       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  Arthritic changes present the first MPJs with the right side worse than left and bunion deformities present.  No evidence of acute fracture. -We discussed both conservative as well as surgical treatment options.  Discussed wearing stiffer shoes and dispensed a graphite insert.  She can use Voltaren gel as needed.  Offered steroid injection.  She will continue ibuprofen as needed as well.  Neurosurgical perspective-discussed 1st MTPJ arthrodesis.  We discussed the surgical postoperative course.  Trula Slade DPM

## 2020-11-17 ENCOUNTER — Ambulatory Visit (INDEPENDENT_AMBULATORY_CARE_PROVIDER_SITE_OTHER): Payer: 59

## 2020-11-17 ENCOUNTER — Ambulatory Visit (INDEPENDENT_AMBULATORY_CARE_PROVIDER_SITE_OTHER): Payer: 59 | Admitting: Podiatry

## 2020-11-17 ENCOUNTER — Other Ambulatory Visit: Payer: Self-pay

## 2020-11-17 DIAGNOSIS — M21611 Bunion of right foot: Secondary | ICD-10-CM

## 2020-11-17 DIAGNOSIS — M205X9 Other deformities of toe(s) (acquired), unspecified foot: Secondary | ICD-10-CM | POA: Diagnosis not present

## 2020-11-17 DIAGNOSIS — M21612 Bunion of left foot: Secondary | ICD-10-CM

## 2020-11-17 NOTE — Patient Instructions (Signed)

## 2020-11-19 DIAGNOSIS — M205X9 Other deformities of toe(s) (acquired), unspecified foot: Secondary | ICD-10-CM | POA: Insufficient documentation

## 2020-11-19 NOTE — Progress Notes (Signed)
Subjective: 65 year old female presents the office today for surgical consultation given pain to both of her big toe joints.  She states the areas are tender mostly there is into the day and she is not able to bend her toes.  She is tried shoe modification of inserts, offloading the any improvement and she looks proceed with surgical intervention at this time. Denies any systemic complaints such as fevers, chills, nausea, vomiting. No acute changes since last appointment, and no other complaints at this time.   Objective: AAO x3, NAD DP/PT pulses palpable bilaterally, CRT less than 3 seconds Decreased range of motion of first MPJs bilaterally consistent with hallux rigidus instrumentation range of motion.  The right side is worse than left symptomatically.  There is trace edema there is no erythema or warmth.  There is no other areas of discomfort identified today.  MMT 5/5.  No pain with calf compression, swelling, warmth, erythema  Assessment: 66 year old female with hallux rigidus right side worse than left  Plan: -All treatment options discussed with the patient including all alternatives, risks, complications.  -I again reviewed the x-rays with her which reveals arthritic changes present the first MPJ.  Bunion is also present.  We discussed with conservative as well surgical treatment options.  Was proceed with surgical intervention after discussion regards to options elects to proceed with first MPJ arthrodesis. -The incision placement as well as the postoperative course was discussed with the patient. I discussed risks of the surgery which include, but not limited to, infection, bleeding, pain, swelling, need for further surgery, delayed or nonhealing, painful or ugly scar, numbness or sensation changes, over/under correction, recurrence, transfer lesions, further deformity, hardware failure, DVT/PE, loss of toe/foot. Patient understands these risks and wishes to proceed with surgery. The  surgical consent was reviewed with the patient all 3 pages were signed. No promises or guarantees were given to the outcome of the procedure. All questions were answered to the best of my ability. Before the surgery the patient was encouraged to call the office if there is any further questions. The surgery will be performed at the [facility] on an outpatient basis. -Patient encouraged to call the office with any questions, concerns, change in symptoms.   Trula Slade DPM

## 2020-12-03 ENCOUNTER — Telehealth: Payer: Self-pay

## 2020-12-03 NOTE — Telephone Encounter (Signed)
DOS 12/10/2020  HALLUX MPJ FUSION RT - [address] FROM [place], [place] #[id number] FOR CPT [phone] GOOD FOR 12/10/2020 ONLY

## 2020-12-10 ENCOUNTER — Other Ambulatory Visit: Payer: Self-pay | Admitting: Podiatry

## 2020-12-10 ENCOUNTER — Encounter: Payer: Self-pay | Admitting: Podiatry

## 2020-12-10 DIAGNOSIS — M205X1 Other deformities of toe(s) (acquired), right foot: Secondary | ICD-10-CM

## 2020-12-10 MED ORDER — ONDANSETRON HCL 4 MG PO TABS: 4.0000 mg | ORAL_TABLET | Freq: Three times a day (TID) | ORAL | 0 refills | Status: DC | PRN

## 2020-12-10 MED ORDER — OXYCODONE-ACETAMINOPHEN 5-325 MG PO TABS: 1.0000 | ORAL_TABLET | Freq: Four times a day (QID) | ORAL | 0 refills | Status: DC | PRN

## 2020-12-10 MED ORDER — CEPHALEXIN 500 MG PO CAPS: 500.0000 mg | ORAL_CAPSULE | Freq: Three times a day (TID) | ORAL | 0 refills | Status: DC

## 2020-12-10 NOTE — Progress Notes (Signed)
Postop medications sent to requested pharmacy.

## 2020-12-15 ENCOUNTER — Ambulatory Visit (INDEPENDENT_AMBULATORY_CARE_PROVIDER_SITE_OTHER): Payer: 59 | Admitting: Podiatry

## 2020-12-15 ENCOUNTER — Ambulatory Visit (INDEPENDENT_AMBULATORY_CARE_PROVIDER_SITE_OTHER): Payer: 59

## 2020-12-15 ENCOUNTER — Other Ambulatory Visit: Payer: Self-pay

## 2020-12-15 DIAGNOSIS — Z9889 Other specified postprocedural states: Secondary | ICD-10-CM

## 2020-12-15 DIAGNOSIS — M205X9 Other deformities of toe(s) (acquired), unspecified foot: Secondary | ICD-10-CM

## 2020-12-15 DIAGNOSIS — M205X1 Other deformities of toe(s) (acquired), right foot: Secondary | ICD-10-CM | POA: Diagnosis not present

## 2020-12-17 ENCOUNTER — Telehealth: Payer: Self-pay | Admitting: *Deleted

## 2020-12-17 NOTE — Telephone Encounter (Signed)
Called patient on 12-11-2020 and stated that I was calling to see how the patient was doing after having surgery with Dr Jacqualyn Posey. Patient stated that there was no pain and was icing and elevating and was still numb and using ibuprofen instead of the pain medicine and I stated to call the office if any concerns or questions. Lattie Haw

## 2020-12-18 NOTE — Progress Notes (Signed)
Subjective: [name] is a 65 y.o. is seen today in office s/p right first MPJ arthrodesis preformed on 12/10/2020.  States that her pain is improving.  Sukhu nonweightbearing in the cam boot.  Denies any systemic complaints such as fevers, chills, nausea, vomiting. No calf pain, chest pain, shortness of breath.   Objective: General: No acute distress, AAOx3  DP/PT pulses palpable 2/4, CRT < 3 sec to all digits.  Protective sensation intact. Motor function intact.  Right foot: Incision is well coapted without any evidence of dehiscence with sutures intact.  There is mild surrounding erythema which is blanchable with bruising.  This appears to be more from inflammation as opposed to infection but there is no ascending cellulitis there is no warmth there is no drainage or pus or any fluctuation or crepitation.  There is no fluid collection.  There is mild edema around the surgical site. There is no significant pain along the surgical site.  Toes in rectus position No other areas of tenderness to bilateral lower extremities.  No other open lesions or pre-ulcerative lesions.  No pain with calf compression, swelling, warmth, erythema.   Assessment and Plan:  Status post right first digit arthrodesis, doing well with no complications   -Treatment options discussed including all alternatives, risks, and complications -X-rays obtained and reviewed.  Hardware intact without any complicating factors. -Antibiotic ointment and dressing applied.  Keep the dressing clean, dry, intact. -Continue cam boot, nonweightbearing -Ice/elevation -Pain medication as needed. -Monitor for any clinical signs or symptoms of infection and DVT/PE and directed to call the office immediately should any occur or go to the ER. -Follow-up as scheduled or sooner if any problems arise. In the meantime, encouraged to call the office with any questions, concerns, change in symptoms.   Celesta Gentile, DPM

## 2020-12-25 ENCOUNTER — Other Ambulatory Visit: Payer: Self-pay

## 2020-12-25 ENCOUNTER — Ambulatory Visit (INDEPENDENT_AMBULATORY_CARE_PROVIDER_SITE_OTHER): Payer: 59 | Admitting: Podiatry

## 2020-12-25 DIAGNOSIS — M205X9 Other deformities of toe(s) (acquired), unspecified foot: Secondary | ICD-10-CM

## 2020-12-25 DIAGNOSIS — Z9889 Other specified postprocedural states: Secondary | ICD-10-CM

## 2020-12-27 NOTE — Progress Notes (Signed)
Subjective: [name] is a 65 y.o. is seen today in office s/p right first MPJ arthrodesis preformed on 12/10/2020.  States that she is improving.  The swelling has improved.  No significant discomfort.  No recent injury or falls or changes otherwise since I last saw her. Denies any systemic complaints such as fevers, chills, nausea, vomiting. No calf pain, chest pain, shortness of breath.   Objective: General: No acute distress, AAOx3  DP/PT pulses palpable 2/4, CRT < 3 sec to all digits.  Protective sensation intact. Motor function intact.  Right foot: Incision is well coapted without any evidence of dehiscence with sutures intact.  There is no surrounding erythema, ascending cellulitis there is no fluctuation crepitation but there is no malodor.  No signs of infection.  Arthrodesis site appears to be stable.  Is no evidence of fluid collection.  No pain to palpation on surgical site or other areas of the foot.  The toe sits in a rectus position. No open lesions. No pain with calf compression, swelling, warmth, erythema.   Assessment and Plan:  Status post right first digit arthrodesis, doing well with no complications   -Treatment options discussed including all alternatives, risks, and complications -At this point she can shower and get incision wet.  Dry thoroughly.  Apply a small amount of antibiotic ointment and a dressing which I did today. -Continue cam boot, nonweightbearing. -Continue to ice and elevate. -Monitor for any clinical signs or symptoms of infection and DVT/PE and directed to call the office immediately should any occur or go to the ER. -Follow-up as scheduled or sooner if any problems arise. In the meantime, encouraged to call the office with any questions, concerns, change in symptoms.   *Repeat x-ray next appointment and start to likely transition to partial weightbearing in the cam boot.  Celesta Gentile, DPM

## 2021-01-06 ENCOUNTER — Other Ambulatory Visit: Payer: Self-pay

## 2021-01-06 ENCOUNTER — Ambulatory Visit (INDEPENDENT_AMBULATORY_CARE_PROVIDER_SITE_OTHER): Payer: 59 | Admitting: Podiatry

## 2021-01-06 ENCOUNTER — Encounter: Payer: Self-pay | Admitting: Podiatry

## 2021-01-06 ENCOUNTER — Ambulatory Visit (INDEPENDENT_AMBULATORY_CARE_PROVIDER_SITE_OTHER): Payer: 59

## 2021-01-06 DIAGNOSIS — Z860100 Personal history of colon polyps, unspecified: Secondary | ICD-10-CM | POA: Insufficient documentation

## 2021-01-06 DIAGNOSIS — I10 Essential (primary) hypertension: Secondary | ICD-10-CM | POA: Insufficient documentation

## 2021-01-06 DIAGNOSIS — M205X1 Other deformities of toe(s) (acquired), right foot: Secondary | ICD-10-CM

## 2021-01-06 DIAGNOSIS — M205X9 Other deformities of toe(s) (acquired), unspecified foot: Secondary | ICD-10-CM

## 2021-01-06 DIAGNOSIS — E785 Hyperlipidemia, unspecified: Secondary | ICD-10-CM | POA: Insufficient documentation

## 2021-01-06 DIAGNOSIS — Z6831 Body mass index (BMI) 31.0-31.9, adult: Secondary | ICD-10-CM | POA: Insufficient documentation

## 2021-01-06 DIAGNOSIS — G4700 Insomnia, unspecified: Secondary | ICD-10-CM | POA: Insufficient documentation

## 2021-01-06 DIAGNOSIS — J309 Allergic rhinitis, unspecified: Secondary | ICD-10-CM | POA: Insufficient documentation

## 2021-01-06 DIAGNOSIS — Z780 Asymptomatic menopausal state: Secondary | ICD-10-CM | POA: Insufficient documentation

## 2021-01-06 DIAGNOSIS — M19049 Primary osteoarthritis, unspecified hand: Secondary | ICD-10-CM | POA: Insufficient documentation

## 2021-01-06 DIAGNOSIS — M1990 Unspecified osteoarthritis, unspecified site: Secondary | ICD-10-CM | POA: Insufficient documentation

## 2021-01-06 DIAGNOSIS — K5792 Diverticulitis of intestine, part unspecified, without perforation or abscess without bleeding: Secondary | ICD-10-CM | POA: Insufficient documentation

## 2021-01-06 DIAGNOSIS — F419 Anxiety disorder, unspecified: Secondary | ICD-10-CM | POA: Insufficient documentation

## 2021-01-08 NOTE — Progress Notes (Signed)
Subjective: [name] is a 65 y.o. is seen today in office s/p right first MPJ arthrodesis preformed on 12/10/2020.  Overall she has been doing better.  She actually presents today wearing a regular shoe.  She thinks her maybe 1 stitch still present on the incision but otherwise incision is healed well.  Swelling is also improved.  No significant pain.  No recent injury or trauma. Denies any systemic complaints such as fevers, chills, nausea, vomiting. No calf pain, chest pain, shortness of breath.   Objective: General: No acute distress, AAOx3  DP/PT pulses palpable 2/4, CRT < 3 sec to all digits.  Protective sensation intact. Motor function intact.  Right foot: Incision is well coapted without any evidence of dehiscence and there is one single suture present on the proximal incision.  There is no erythema, ascending cellulitis there is no drainage or pus or any obvious signs of infection.  There is minimal edema to the surgical site.  Arthrodesis appears to be stable.  No open lesions. No pain with calf compression, swelling, warmth, erythema.   Assessment and Plan:  Status post right first digit arthrodesis, doing well with no complications   -Treatment options discussed including all alternatives, risks, and complications -X-rays obtained reviewed.  Hardware intact but any complicating factors.  Still mild radiolucency along the arthrodesis site. -On her to go back in the cam boot least weight-bear in the cam boot at this time.  On her continue to ice elevate as well as compression help with any postoperative edema. -Monitor for any clinical signs or symptoms of infection and DVT/PE and directed to call the office immediately should any occur or go to the ER. -Follow-up as scheduled or sooner if any problems arise. In the meantime, encouraged to call the office with any questions, concerns, change in symptoms.   *Repeat x-ray next appointment and start to likely transition to regular shoe as  tolerated consider physical therapy if needed.  Celesta Gentile, DPM

## 2021-01-29 ENCOUNTER — Ambulatory Visit (INDEPENDENT_AMBULATORY_CARE_PROVIDER_SITE_OTHER): Payer: 59 | Admitting: Podiatry

## 2021-01-29 ENCOUNTER — Encounter: Payer: Self-pay | Admitting: Podiatry

## 2021-01-29 ENCOUNTER — Other Ambulatory Visit: Payer: Self-pay

## 2021-01-29 ENCOUNTER — Ambulatory Visit (INDEPENDENT_AMBULATORY_CARE_PROVIDER_SITE_OTHER): Payer: 59

## 2021-01-29 DIAGNOSIS — Z9889 Other specified postprocedural states: Secondary | ICD-10-CM

## 2021-01-29 DIAGNOSIS — M205X9 Other deformities of toe(s) (acquired), unspecified foot: Secondary | ICD-10-CM

## 2021-02-04 NOTE — Progress Notes (Signed)
Subjective: [name] is a 65 y.o. is seen today in office s/p right first MPJ arthrodesis preformed on 12/10/2020.  States that she is doing well she has no concerns.  She gets occasional swelling on the bottom but otherwise doing well.  She has no concerns.  Denies any fevers, chills, nausea, vomiting.  No Pain, chest pain, shortness of breath.Denies any systemic complaints such as fevers, chills, nausea, vomiting. No calf pain, chest pain, shortness of breath.   Objective: General: No acute distress, AAOx3  DP/PT pulses palpable 2/4, CRT < 3 sec to all digits.  Protective sensation intact. Motor function intact.  Right foot: Incision is well coapted without any evidence of dehiscence and scar is well formed.  The arthrodesis site is stable.  There is minimal edema.  There is no erythema or warmth.  No open lesions.  No signs of infection.  No open lesions. No pain with calf compression, swelling, warmth, erythema.   Assessment and Plan:  Status post right first digit arthrodesis, doing well with no complications   -Treatment options discussed including all alternatives, risks, and complications -X-rays obtained reviewed with hardware intact.  Does appear to be increased consolidation present. -This point continue with regular shoes as tolerated and gradually increase her activity.  Still recommend continue icing as well as compression to help with any postoperative edema.   Trula Slade DPM

## 2021-03-12 ENCOUNTER — Ambulatory Visit (INDEPENDENT_AMBULATORY_CARE_PROVIDER_SITE_OTHER): Payer: 59 | Admitting: Podiatry

## 2021-03-12 ENCOUNTER — Other Ambulatory Visit: Payer: Self-pay

## 2021-03-12 ENCOUNTER — Ambulatory Visit (INDEPENDENT_AMBULATORY_CARE_PROVIDER_SITE_OTHER): Payer: 59

## 2021-03-12 DIAGNOSIS — M205X9 Other deformities of toe(s) (acquired), unspecified foot: Secondary | ICD-10-CM

## 2021-03-12 DIAGNOSIS — Z9889 Other specified postprocedural states: Secondary | ICD-10-CM | POA: Diagnosis not present

## 2021-03-12 MED ORDER — OXYCODONE-ACETAMINOPHEN 5-325 MG PO TABS: 1.0000 | ORAL_TABLET | Freq: Three times a day (TID) | ORAL | 0 refills | Status: DC | PRN

## 2021-03-16 NOTE — Progress Notes (Signed)
Subjective: [name] is a 65 y.o. is seen today in office s/p right first MPJ arthrodesis preformed on 12/10/2020.  She states that she has been feeling well without any significant pain or issues.  Some occasional swelling but this is improving.  She is back to regular shoe.  No recent injury or falls. Denies any systemic complaints such as fevers, chills, nausea, vomiting. No calf pain, chest pain, shortness of breath.   Objective: General: No acute distress, AAOx3  DP/PT pulses palpable 2/4, CRT < 3 sec to all digits.  Protective sensation intact. Motor function intact.  Right foot: Incision is well coapted without any evidence of dehiscence and scar is well formed.  The arthrodesis site is stable.  There is trace edema.  There is no erythema or warmth.  No signs of infection.  No significant discomfort to palpation on the surgical site. No pain with calf compression, swelling, warmth, erythema.   Assessment and Plan:  Status post right first digit arthrodesis, doing well with no complications   -Treatment options discussed including all alternatives, risks, and complications -X-rays obtained reviewed with hardware intact.  Increase consolidation across the arthrodesis site. -She is back to her regular shoe.  Discussed with her gradually increase activity level.  Recommend compression anklet to help with any postoperative edema.  Isolate in the day.  Is any increasing pain return to the cam boot in the meantime.  Return in about 2 months (around 05/12/2021).

## 2021-12-23 DIAGNOSIS — I10 Essential (primary) hypertension: Secondary | ICD-10-CM | POA: Diagnosis not present

## 2021-12-23 DIAGNOSIS — E785 Hyperlipidemia, unspecified: Secondary | ICD-10-CM | POA: Diagnosis not present

## 2021-12-23 DIAGNOSIS — R0602 Shortness of breath: Secondary | ICD-10-CM | POA: Diagnosis not present

## 2021-12-23 DIAGNOSIS — M1990 Unspecified osteoarthritis, unspecified site: Secondary | ICD-10-CM | POA: Diagnosis not present

## 2021-12-28 DIAGNOSIS — Z1231 Encounter for screening mammogram for malignant neoplasm of breast: Secondary | ICD-10-CM | POA: Diagnosis not present

## 2022-01-01 ENCOUNTER — Telehealth: Payer: Self-pay

## 2022-01-01 NOTE — Telephone Encounter (Signed)
Notes scanned to referral 

## 2022-01-05 NOTE — Progress Notes (Addendum)
?  ?Cardiology Office Note ? ? ?Date:  01/06/2022  ? ?ID:  [name], DOB June 20, 1956, MRN [id number] ? ?PCP:  Kathyrn Lass, MD  ? ? ?Chief Complaint  ?Patient presents with  ? Establish Care  ? ?Shortness of breath ? ?Wt Readings from Last 3 Encounters:  ?01/06/22 191 lb (86.6 kg)  ?01/13/12 186 lb (84.4 kg)  ?  ? ?  ?History of Present Illness: ?[name] is a 66 y.o. female who is being seen today for the evaluation of shortnss of breath at the request of Marda Stalker, [place].  Her mother, [name], was my patient and she had:"posterior MI in 62 (age 49), treated with PTCA, subsequent restenosis of an OM which was unable to be opened." ? ?She retired recently and has been trying to be more active of late.  Worse with gardening.  Had some DOE at the end of the walk with her dog.  No other cardio type activity. ? ?Father had MI at 58.  Mother had MI in her 66s.  No siblings with heart issues.   ? 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 Had sharp chest pain at that time. ? ?The shortness of breath has concerned her as it is limiting her activity.  No associated pain. ? ? ? ?Past Medical History:  ?Diagnosis Date  ? Allergic rhinitis   ? Anxiety   ? Diverticulitis   ? High cholesterol   ? History of colonic polyps   ? Hyperlipidemia   ? Hypertension   ? Menopausal and female climacteric states   ? Menopausal symptom   ? Obesity   ? Osteoarthritis   ? ? ?Past Surgical History:  ?Procedure Laterality Date  ? APPENDECTOMY    ? PELVIC LAPAROSCOPY    ? 1- bilateral cystectomy 2- LSO  ? ? ? ?Current Outpatient Medications  ?Medication Sig Dispense Refill  ? calcium carbonate (OS-CAL) 600 MG TABS Take 600 mg by mouth 2 (two) times daily with a meal.    ? cephALEXin (KEFLEX) 500 MG capsule Take 1 capsule (500 mg total) by mouth 3 (three) times daily. 21 capsule 0  ? cholecalciferol (VITAMIN D) 1000 UNITS tablet Take 1,000 Units by mouth daily.    ? FLUoxetine (PROZAC) 20 MG capsule Take 20 mg by mouth daily.     ? lisinopril (PRINIVIL,ZESTRIL) 20 MG tablet Take 20 mg by mouth daily.    ? lisinopril-hydrochlorothiazide (ZESTORETIC) 20-12.5 MG tablet Take 1 tablet by mouth daily.    ? ondansetron (ZOFRAN) 4 MG tablet Take 1 tablet (4 mg total) by mouth every 8 (eight) hours as needed for nausea or vomiting. 20 tablet 0  ? oxyCODONE-acetaminophen (PERCOCET/ROXICET) 5-325 MG tablet Take 1 tablet by mouth every 8 (eight) hours as needed for severe pain. 10 tablet 0  ? Oyster Shell Calcium 500 MG TABS 1 tablet with meals    ? Probiotic Product (PROBIOTIC PO) Take by mouth.    ? pseudoephedrine (SUDAFED) 30 MG tablet 2 tablets as needed    ? simvastatin (ZOCOR) 20 MG tablet Take 20 mg by mouth every evening.    ? simvastatin (ZOCOR) 40 MG tablet Take 40 mg by mouth at bedtime.    ? ?No current facility-administered medications for this visit.  ? ? ?Allergies:   Sulfamethoxazole, Sulfa antibiotics, Sulfamethoxazole-trimethoprim, and Tetracycline  ? ? ?Social History:  The patient  reports that she has never smoked. She has never been exposed to tobacco smoke. She has never used smokeless  tobacco. She reports current alcohol use. She reports that she does not use drugs.  ? ?Family History:  The patient's family history includes Heart attack in her father; Heart disease in her father and mother; Macular degeneration in her sister; Pancreatic cancer in her mother.  ? ? ?ROS:  Please see the history of present illness.   Otherwise, review of systems are positive for dyspnea on exertion.   All other systems are reviewed and negative.  ? ? ?PHYSICAL EXAM: ?VS:  BP 110/72   Pulse 91   Ht 5' 3.5" (1.613 m)   Wt 191 lb (86.6 kg)   SpO2 96%   BMI 33.30 kg/m?  , BMI Body mass index is 33.3 kg/m?. ?GEN: Well nourished, well developed, in no acute distress ?HEENT: normal ?Neck: no JVD, carotid bruits, or masses ?Cardiac: RRR; no murmurs, rubs, or gallops,no edema  ?Respiratory:  clear to auscultation bilaterally, normal work of  breathing ?GI: soft, nontender, nondistended, + BS ?MS: no deformity or atrophy ?Skin: warm and dry, no rash ?Neuro:  Strength and sensation are intact ?Psych: euthymic mood, full affect ? ? ?EKG:   ?The ekg ordered today demonstrates normal sinus rhythm, poor R wave progression, left axis deviation ? ? ?Recent Labs: ?No results found for requested labs within last 8760 hours.  ? ?Lipid Panel ?   ?Component Value Date/Time  ? CHOL  02/26/2008 0530  ?  132        ?ATP III CLASSIFICATION: ? <200     mg/dL   Desirable ? 200-239  mg/dL   Borderline High ? >=240    mg/dL   High  ? TRIG 88 02/26/2008 0530  ? ?  ?Other studies Reviewed: ?Additional studies/ records that were reviewed today with results demonstrating: labs reviewed. ? ? ?ASSESSMENT AND PLAN: ? ?Shortness of breath: No symptoms of any fluid overload.  No weight gain ?Hyperlipidemia: Total cholesterol 1, HDL 57, LDL 98, triglycerides 142.  Taking Simvastatin 20 mg daily.  Did not tolerate Lipitor. ?Abnormal ECG: Echocardiogram will help evaluate LV function.  R wave progression is delayed on ECG. ?Hypertension: Low-salt diet.  Whole food, plant-based diet would be beneficial.  Readings have been controlled. ? ? ?Current medicines are reviewed at length with the patient today.  The patient concerns regarding her medicines were addressed. ? ?The following changes have been made:  No change ? ?Labs/ tests ordered today include: Echo, CTA coronaries ?No orders of the defined types were placed in this encounter. ? ? ?Recommend 150 minutes/week of aerobic exercise ?Low fat, low carb, high fiber diet recommended ? ?Disposition:   FU for testing ? ? ?Signed, ?Larae Grooms, MD  ?01/06/2022 10:04 AM    ?[address] [facility], [place], [place]  [address] ?Phone: [phone]; Fax: [phone]  ? ?

## 2022-01-06 ENCOUNTER — Ambulatory Visit: Payer: Medicare Other | Admitting: Interventional Cardiology

## 2022-01-06 ENCOUNTER — Other Ambulatory Visit: Payer: Self-pay

## 2022-01-06 ENCOUNTER — Encounter: Payer: Self-pay | Admitting: Interventional Cardiology

## 2022-01-06 VITALS — BP 110/72 | HR 91 | Ht 63.5 in | Wt 191.0 lb

## 2022-01-06 DIAGNOSIS — R9431 Abnormal electrocardiogram [ECG] [EKG]: Secondary | ICD-10-CM | POA: Diagnosis not present

## 2022-01-06 DIAGNOSIS — R0609 Other forms of dyspnea: Secondary | ICD-10-CM | POA: Diagnosis not present

## 2022-01-06 DIAGNOSIS — E782 Mixed hyperlipidemia: Secondary | ICD-10-CM | POA: Diagnosis not present

## 2022-01-06 DIAGNOSIS — I10 Essential (primary) hypertension: Secondary | ICD-10-CM

## 2022-01-06 MED ORDER — METOPROLOL TARTRATE 100 MG PO TABS: ORAL_TABLET | ORAL | 0 refills | Status: DC

## 2022-01-06 NOTE — Patient Instructions (Addendum)
Medication Instructions:  ?Your physician recommends that you continue on your current medications as directed. Please refer to the Current Medication list given to you today. ? ?*If you need a refill on your cardiac medications before your next appointment, please call your pharmacy* ? ? ?Lab Work: ?none ? ?Testing/Procedures: ? ?Your physician has requested that you have an echocardiogram. Echocardiography is a painless test that uses sound waves to create images of your heart. It provides your doctor with information about the size and shape of your heart and how well your heart?s chambers and valves are working. This procedure takes approximately one hour. There are no restrictions for this procedure. ? ?Your physician has requested that you have cardiac CT. Cardiac computed tomography (CT) is a painless test that uses an x-ray machine to take clear, detailed pictures of your heart. For further information please visit [email or web address]. Please follow instruction sheet as given. ? ? ? ?Follow-Up: ?At [facility], you and your health needs are our priority.  As part of our continuing mission to provide you with exceptional heart care, we have created designated Provider Care Teams.  These Care Teams include your primary Cardiologist (physician) and Advanced Practice Providers (APPs -  Physician Assistants and Nurse Practitioners) who all work together to provide you with the care you need, when you need it. ? ?We recommend signing up for the patient portal called "MyChart".  Sign up information is provided on this After Visit Summary.  MyChart is used to connect with patients for Virtual Visits (Telemedicine).  Patients are able to view lab/test results, encounter notes, upcoming appointments, etc.  Non-urgent messages can be sent to your provider as well.   ?To learn more about what you can do with MyChart, go to [email or web address].   ? ?Your next appointment:   ?Based on results ? ?The format for your  next appointment:   ?In Person ? ?Provider:   ?Larae Grooms, MD   ? ? ?Other Instructions ?  ? ?Your cardiac CT will be scheduled at one of the below locations:  ? ?[facility] ?[address] ?[place], [place] [address] ?[phone] ? ?OR ? ?[place] [address] ?[address] ?[place],  [address] ?[phone] ? ?If scheduled at [facility], please arrive at the [facility] and Children's Entrance (Entrance C2) of [facility] 30 minutes prior to test start time. ?You can use the FREE valet parking offered at entrance C (encouraged to control the heart rate for the test)  ?Proceed to the [facility] Radiology Department (first floor) to check-in and test prep. ? ?All radiology patients and guests should use entrance C2 at [facility], accessed from [facility], even though the hospital's physical address listed is [address]. ? ? ? ?If scheduled at [facility], please arrive 15 mins early for check-in and test prep. ? ?Please follow these instructions carefully (unless otherwise directed): ? ? ?On the Night Before the Test: ?Be sure to Drink plenty of water. ?Do not consume any caffeinated/decaffeinated beverages or chocolate 12 hours prior to your test. ?Do not take any antihistamines 12 hours prior to your test. ? ? ?On the Day of the Test: ?Drink plenty of water until 1 hour prior to the test. ?Do not eat any food 4 hours prior to the test. ?You may take your regular medications prior to the test.  ?Take metoprolol (Lopressor) two hours prior to test. ?HOLD Furosemide/Hydrochlorothiazide morning  of the test. ?FEMALES- please wear underwire-free bra if available, avoid dresses & tight clothing ? ?     ?After the Test: ?Drink plenty of water. ?After receiving IV contrast, you may experience a mild flushed feeling. This is normal. ?On occasion, you may experience a mild rash up to 24  hours after the test. This is not dangerous. If this occurs, you can take Benadryl 25 mg and increase your fluid intake. ?If you experience trouble breathing, this can be serious. If it is severe call 911 IMMEDIATELY. If it is mild, please call our office. ?If you take any of these medications: Glipizide/Metformin, Avandament, Glucavance, please do not take 48 hours after completing test unless otherwise instructed. ? ?We will call to schedule your test 2-4 weeks out understanding that some insurance companies will need an authorization prior to the service being performed.  ? ?For non-scheduling related questions, please contact the cardiac imaging nurse navigator should you have any questions/concerns: ?Marchia Bond, Cardiac Imaging Nurse Navigator ?Gordy Clement, Cardiac Imaging Nurse Navigator ?[facility] ?Direct Office Dial: [phone]  ? ?For scheduling needs, including cancellations and rescheduling, please call [place], [phone]. ? ? ?

## 2022-01-19 ENCOUNTER — Telehealth (HOSPITAL_COMMUNITY): Payer: Self-pay | Admitting: *Deleted

## 2022-01-19 ENCOUNTER — Ambulatory Visit: Payer: Medicare Other | Admitting: Podiatry

## 2022-01-19 ENCOUNTER — Ambulatory Visit (INDEPENDENT_AMBULATORY_CARE_PROVIDER_SITE_OTHER): Payer: Medicare Other

## 2022-01-19 DIAGNOSIS — M7732 Calcaneal spur, left foot: Secondary | ICD-10-CM | POA: Diagnosis not present

## 2022-01-19 DIAGNOSIS — M722 Plantar fascial fibromatosis: Secondary | ICD-10-CM

## 2022-01-19 DIAGNOSIS — M205X9 Other deformities of toe(s) (acquired), unspecified foot: Secondary | ICD-10-CM

## 2022-01-19 MED ORDER — MELOXICAM 15 MG PO TABS
15.0000 mg | ORAL_TABLET | Freq: Every day | ORAL | 0 refills | Status: DC | PRN
Start: 2022-01-19 — End: 2022-02-18

## 2022-01-19 NOTE — Telephone Encounter (Signed)
Attempted to call patient regarding upcoming cardiac CT appointment. °Left message on voicemail with name and callback number ° °Story Conti RN Navigator Cardiac Imaging °[facility] °[phone] Office °[phone] Cell ° °

## 2022-01-19 NOTE — Patient Instructions (Signed)

## 2022-01-19 NOTE — Progress Notes (Signed)
? ?Established Patient Office Visit ? ?Subjective:  ?Patient ID: [name], female    DOB: 1956/05/13  Age: 66 y.o. MRN: [id number] ? ?CC:  ?Chief Complaint  ?Patient presents with  ? Plantar Fasciitis  ?  LEFT HEEL PAIN- ONGOING FOR A COUPLE OF MONTHS NOW- NO INJURES OR FALLS.   ? ? ?HPI ?[name] presents for concerns of left heel pain which has been ongoing about couple of months.  She states that she gets sharp pain at the heel is worse with the first couple steps in the morning.  She states that during the day she does not have any discomfort but she does get discomfort at nighttime after she sits down.  She tried icing, stretching some.  She does have a brace as well.  ? ?She also does get occasional discomfort the first MPJ she is also not doing surgery on this.  The right foot is been doing well and she is very happy with that side. ? ?Past Medical History:  ?Diagnosis Date  ? Allergic rhinitis   ? Anxiety   ? Diverticulitis   ? High cholesterol   ? History of colonic polyps   ? Hyperlipidemia   ? Hypertension   ? Menopausal and female climacteric states   ? Menopausal symptom   ? Obesity   ? Osteoarthritis   ? ? ?Past Surgical History:  ?Procedure Laterality Date  ? APPENDECTOMY    ? PELVIC LAPAROSCOPY    ? 1- bilateral cystectomy 2- LSO  ? ? ?Family History  ?Problem Relation Age of Onset  ? Heart disease Mother   ? Pancreatic cancer Mother   ? Heart attack Father   ? Heart disease Father   ? Macular degeneration Sister   ? ? ?Social History  ? ?Socioeconomic History  ? Marital status: Divorced  ?  Spouse name: Not on file  ? Number of children: Not on file  ? Years of education: Not on file  ? Highest education level: Not on file  ?Occupational History  ? Not on file  ?Tobacco Use  ? Smoking status: Never  ?  Passive exposure: Never  ? Smokeless tobacco: Never  ?Substance and Sexual Activity  ? Alcohol use: Yes  ?  Comment: occ  ? Drug use: No  ? Sexual activity: Never  ?Other Topics Concern  ? Not  on file  ?Social History Narrative  ? Not on file  ? ?Social Determinants of Health  ? ?Financial Resource Strain: Not on file  ?Food Insecurity: Not on file  ?Transportation Needs: Not on file  ?Physical Activity: Not on file  ?Stress: Not on file  ?Social Connections: Not on file  ?Intimate Partner Violence: Not on file  ? ? ?Outpatient Medications Prior to Visit  ?Medication Sig Dispense Refill  ? calcium carbonate (OS-CAL) 600 MG TABS Take 600 mg by mouth 2 (two) times daily with a meal.    ? cephALEXin (KEFLEX) 500 MG capsule Take 1 capsule (500 mg total) by mouth 3 (three) times daily. 21 capsule 0  ? cholecalciferol (VITAMIN D) 1000 UNITS tablet Take 1,000 Units by mouth daily.    ? FLUoxetine (PROZAC) 20 MG capsule Take 20 mg by mouth daily.    ? lisinopril (PRINIVIL,ZESTRIL) 20 MG tablet Take 20 mg by mouth daily.    ? lisinopril-hydrochlorothiazide (ZESTORETIC) 20-12.5 MG tablet Take 1 tablet by mouth daily.    ? metoprolol tartrate (LOPRESSOR) 100 MG tablet Take one tablet by mouth 2 hours  prior to CT Scan 1 tablet 0  ? ondansetron (ZOFRAN) 4 MG tablet Take 1 tablet (4 mg total) by mouth every 8 (eight) hours as needed for nausea or vomiting. 20 tablet 0  ? oxyCODONE-acetaminophen (PERCOCET/ROXICET) 5-325 MG tablet Take 1 tablet by mouth every 8 (eight) hours as needed for severe pain. 10 tablet 0  ? Oyster Shell Calcium 500 MG TABS 1 tablet with meals    ? Probiotic Product (PROBIOTIC PO) Take by mouth.    ? pseudoephedrine (SUDAFED) 30 MG tablet 2 tablets as needed    ? simvastatin (ZOCOR) 20 MG tablet Take 20 mg by mouth every evening.    ? simvastatin (ZOCOR) 40 MG tablet Take 40 mg by mouth at bedtime.    ? ?No facility-administered medications prior to visit.  ? ? ?Allergies  ?Allergen Reactions  ? Sulfamethoxazole Anaphylaxis  ? Sulfa Antibiotics   ? Sulfamethoxazole-Trimethoprim Hives  ? Tetracycline Hives  ?  Other reaction(s): hives  ? ? ?ROS ?Review of Systems  ?All other systems reviewed and  are negative. ? ?  ?Objective:  ?  ?Physical Exam ?General: AAO x3, NAD ? ?Dermatological: Skin is warm, dry and supple bilateral.  There are no open sores, no preulcerative lesions, no rash or signs of infection present. ? ?Vascular: Dorsalis Pedis artery and Posterior Tibial artery pedal pulses are 2/4 bilateral with immedate capillary fill time.  There is no pain with calf compression, swelling, warmth, erythema.  ? ?Neruologic: Grossly intact via light touch bilateral.  Negative sign. ? ?Musculoskeletal: Incision on the right foot is well-healed.  No pain on the first MPJ.  On the left foot there is tenderness palpation along the plantar central, plantar lateral aspect of the heel and the insertion of plantar fascia.  Prominence of the heel plantarly with atrophy of the fat pad.  There is no discomfort the arch of the foot.  There is no pain with lateral compression of calcaneus.  Muscular strength 5/5 in all groups tested bilateral. ? ?Gait: Unassisted, Nonantalgic.  ? ?There were no vitals taken for this visit. ?Wt Readings from Last 3 Encounters:  ?01/06/22 191 lb (86.6 kg)  ?01/13/12 186 lb (84.4 kg)  ? ? ? ?Health Maintenance Due  ?Topic Date Due  ? HIV Screening  Never done  ? Hepatitis C Screening  Never done  ? COLONOSCOPY (Pts 45-62yr Insurance coverage will need to be confirmed)  Never done  ? Zoster Vaccines- Shingrix (1 of 2) Never done  ? PAP SMEAR-Modifier  01/13/2015  ? MAMMOGRAM  12/24/2017  ? COVID-19 Vaccine (4 - Booster for Pfizer series) 10/23/2020  ? DEXA SCAN  Never done  ? Pneumonia Vaccine 65 Years old (1 - PCV) 12/24/2021  ? ? ?There are no preventive care reminders to display for this patient. ? ?No results found for: TSH ?Lab Results  ?Component Value Date  ? WBC 7.0 02/26/2008  ? HGB 8.8 (L) 02/26/2008  ? HCT 25.3 (L) 02/26/2008  ? MCV 92.2 02/26/2008  ? PLT 240 02/26/2008  ? ?Lab Results  ?Component Value Date  ? NA 137 02/29/2008  ? K 4.4 02/29/2008  ? CO2 26 02/29/2008  ? GLUCOSE  97 02/29/2008  ? BUN 22 02/29/2008  ? CREATININE 0.75 02/29/2008  ? BILITOT 0.7 02/29/2008  ? ALKPHOS 78 02/29/2008  ? AST 79 (H) 02/29/2008  ? ALT 137 (H) 02/29/2008  ? PROT 5.1 (L) 02/29/2008  ? ALBUMIN 2.4 (L) 02/29/2008  ? CALCIUM 8.7 02/29/2008  ? ?  Lab Results  ?Component Value Date  ? CHOL  02/26/2008  ?  132        ?ATP III CLASSIFICATION: ? <200     mg/dL   Desirable ? 200-239  mg/dL   Borderline High ? >=240    mg/dL   High  ? ?No results found for: HDL ?No results found for: [place] ?Lab Results  ?Component Value Date  ? TRIG 88 02/26/2008  ? ?No results found for: CHOLHDL ?No results found for: HGBA1C ? ?  ?Assessment & Plan:  ? ?Problem List Items Addressed This Visit   ?None ?Visit Diagnoses   ? ? Plantar fasciitis of left foot    -  Primary  ? Relevant Orders  ? DG Foot Complete Left  ? ?  ? ?-X-rays were obtained and reviewed.  3 views of the left foot were obtained.  Calcaneal spurring is present.  Decreased calcaneal inclination angle.  Arthritic changes present the first MPJ ?-Prescribed mobic. Discussed side effects of the medication and directed to stop if any are to occur and call the office.  ?-Discussed traction, icing daily.  Discussed shoe modifications and good arch support.  She already has a brace.  We discussed physical therapy as well.  She is in continue with stretching, icing as well as anti-inflammatories for now. ?-She does not consider surgical intervention in the future for her left first MPJ.  Need to proceed with first MPJ arthrodesis ? ?No orders of the defined types were placed in this encounter. ? ? ?Follow-up: No follow-ups on file.  ? ? ?Trula Slade, DPM ?

## 2022-01-20 ENCOUNTER — Encounter (HOSPITAL_COMMUNITY): Payer: Self-pay

## 2022-01-20 ENCOUNTER — Ambulatory Visit (HOSPITAL_COMMUNITY)
Admission: RE | Admit: 2022-01-20 | Discharge: 2022-01-20 | Disposition: A | Discharge status: Home / Self Care | Payer: Medicare Other | Source: Ambulatory Visit | Attending: Interventional Cardiology | Admitting: Interventional Cardiology

## 2022-01-20 DIAGNOSIS — I2510 Atherosclerotic heart disease of native coronary artery without angina pectoris: Secondary | ICD-10-CM | POA: Diagnosis not present

## 2022-01-20 DIAGNOSIS — R0609 Other forms of dyspnea: Secondary | ICD-10-CM | POA: Diagnosis not present

## 2022-01-20 DIAGNOSIS — I700 Atherosclerosis of aorta: Secondary | ICD-10-CM | POA: Insufficient documentation

## 2022-01-20 MED ORDER — NITROGLYCERIN 0.4 MG SL SUBL
SUBLINGUAL_TABLET | SUBLINGUAL | Status: AC
  Filled 2022-01-20: qty 2

## 2022-01-20 MED ORDER — IOHEXOL 350 MG/ML SOLN
100.0000 mL | Freq: Once | INTRAVENOUS | Status: AC | PRN
  Administered 2022-01-20: 100 mL via INTRAVENOUS

## 2022-01-20 MED ORDER — NITROGLYCERIN 0.4 MG SL SUBL
0.8000 mg | SUBLINGUAL_TABLET | Freq: Once | SUBLINGUAL | Status: AC
  Administered 2022-01-20: 0.8 mg via SUBLINGUAL

## 2022-01-25 ENCOUNTER — Ambulatory Visit (HOSPITAL_COMMUNITY): Payer: Medicare Other | Attending: Cardiology

## 2022-01-25 DIAGNOSIS — R0609 Other forms of dyspnea: Secondary | ICD-10-CM

## 2022-01-25 LAB — ECHOCARDIOGRAM COMPLETE
Area-P 1/2: 2.29 cm2
S' Lateral: 2.6 cm

## 2022-01-26 ENCOUNTER — Telehealth: Payer: Self-pay | Admitting: *Deleted

## 2022-01-26 DIAGNOSIS — E782 Mixed hyperlipidemia: Secondary | ICD-10-CM

## 2022-01-26 NOTE — Telephone Encounter (Signed)
-----   Message from Jettie Booze, MD sent at 01/25/2022  6:15 PM EDT ----- ?Normal LV/RV/valvular function ?

## 2022-01-26 NOTE — Telephone Encounter (Signed)
Left message to call office

## 2022-01-26 NOTE — Telephone Encounter (Signed)
-----   Message from Jettie Booze, MD sent at 01/24/2022  8:49 PM EDT ----- ?Aortic atherosclerosis.  Mild nonobstructive CAD.  Percentile of calcium score above the 75th percentile.  Would switch simvastatin to rosuvastatin 20 mg daily for better secondary prevention.  Recheck lipids and liver in 2-3 months.  ?

## 2022-02-08 MED ORDER — ROSUVASTATIN CALCIUM 20 MG PO TABS: 20.0000 mg | ORAL_TABLET | Freq: Every day | ORAL | 3 refills | Status: DC

## 2022-02-08 NOTE — Telephone Encounter (Signed)
I spoke with patient and reviewed echo and CT results with her.  She did not tolerate Lipitor in the past but is willing to try Rosuvastatin.  She will let us know if she develops muscle pain.  Prescription [facility] to [facility].  Patient will come in for lab work on April 19, 2022.  ?

## 2022-02-17 ENCOUNTER — Other Ambulatory Visit: Payer: Self-pay | Admitting: Podiatry

## 2022-02-17 NOTE — Telephone Encounter (Signed)
Interaction between this medication and prozac, please advise.

## 2022-04-19 ENCOUNTER — Other Ambulatory Visit: Payer: Medicare Other

## 2022-04-19 DIAGNOSIS — E782 Mixed hyperlipidemia: Secondary | ICD-10-CM | POA: Diagnosis not present

## 2022-04-19 LAB — HEPATIC FUNCTION PANEL
ALT: 23 IU/L (ref 0–32)
AST: 17 IU/L (ref 0–40)
Albumin: 4.7 g/dL (ref 3.9–4.9)
Alkaline Phosphatase: 64 IU/L (ref 44–121)
Bilirubin Total: 0.6 mg/dL (ref 0.0–1.2)
Bilirubin, Direct: 0.18 mg/dL (ref 0.00–0.40)
Total Protein: 6.5 g/dL (ref 6.0–8.5)

## 2022-04-19 LAB — LIPID PANEL
Chol/HDL Ratio: 3.5 ratio (ref 0.0–4.4)
Cholesterol, Total: 178 mg/dL (ref 100–199)
HDL: 51 mg/dL (ref >39)
LDL Chol Calc (NIH): 94 mg/dL (ref 0–99)
Triglycerides: 193 mg/dL — ABNORMAL HIGH (ref 0–149)
VLDL Cholesterol Cal: 33 mg/dL (ref 5–40)

## 2022-09-09 DIAGNOSIS — I2510 Atherosclerotic heart disease of native coronary artery without angina pectoris: Secondary | ICD-10-CM | POA: Diagnosis not present

## 2022-09-09 DIAGNOSIS — I10 Essential (primary) hypertension: Secondary | ICD-10-CM | POA: Diagnosis not present

## 2022-09-09 DIAGNOSIS — E785 Hyperlipidemia, unspecified: Secondary | ICD-10-CM | POA: Diagnosis not present

## 2022-09-09 DIAGNOSIS — I700 Atherosclerosis of aorta: Secondary | ICD-10-CM | POA: Diagnosis not present

## 2022-09-09 DIAGNOSIS — M1990 Unspecified osteoarthritis, unspecified site: Secondary | ICD-10-CM | POA: Diagnosis not present

## 2022-09-09 DIAGNOSIS — Z23 Encounter for immunization: Secondary | ICD-10-CM | POA: Diagnosis not present

## 2022-11-04 DIAGNOSIS — H35033 Hypertensive retinopathy, bilateral: Secondary | ICD-10-CM | POA: Diagnosis not present

## 2022-11-23 DIAGNOSIS — M79642 Pain in left hand: Secondary | ICD-10-CM | POA: Diagnosis not present

## 2022-11-23 DIAGNOSIS — M79641 Pain in right hand: Secondary | ICD-10-CM | POA: Diagnosis not present

## 2022-11-23 DIAGNOSIS — M180 Bilateral primary osteoarthritis of first carpometacarpal joints: Secondary | ICD-10-CM | POA: Diagnosis not present

## 2022-11-26 DIAGNOSIS — S61213A Laceration without foreign body of left middle finger without damage to nail, initial encounter: Secondary | ICD-10-CM | POA: Diagnosis not present

## 2023-01-03 DIAGNOSIS — Z1231 Encounter for screening mammogram for malignant neoplasm of breast: Secondary | ICD-10-CM | POA: Diagnosis not present

## 2023-01-10 DIAGNOSIS — R928 Other abnormal and inconclusive findings on diagnostic imaging of breast: Secondary | ICD-10-CM | POA: Diagnosis not present

## 2023-01-10 DIAGNOSIS — R922 Inconclusive mammogram: Secondary | ICD-10-CM | POA: Diagnosis not present

## 2023-01-10 DIAGNOSIS — N6489 Other specified disorders of breast: Secondary | ICD-10-CM | POA: Diagnosis not present

## 2023-02-18 ENCOUNTER — Other Ambulatory Visit: Payer: Self-pay | Admitting: Interventional Cardiology

## 2023-03-16 ENCOUNTER — Other Ambulatory Visit: Payer: Self-pay | Admitting: Interventional Cardiology

## 2023-03-20 ENCOUNTER — Other Ambulatory Visit: Payer: Self-pay | Admitting: Interventional Cardiology

## 2023-03-31 ENCOUNTER — Other Ambulatory Visit: Payer: Self-pay | Admitting: Interventional Cardiology

## 2023-04-17 ENCOUNTER — Other Ambulatory Visit: Payer: Self-pay | Admitting: Interventional Cardiology

## 2023-04-25 ENCOUNTER — Ambulatory Visit: Payer: Medicare Other | Attending: Physician Assistant | Admitting: Physician Assistant

## 2023-04-25 ENCOUNTER — Encounter: Payer: Self-pay | Admitting: Physician Assistant

## 2023-04-25 VITALS — BP 112/78 | HR 93 | Ht 63.5 in | Wt 190.4 lb

## 2023-04-25 DIAGNOSIS — E785 Hyperlipidemia, unspecified: Secondary | ICD-10-CM | POA: Diagnosis not present

## 2023-04-25 DIAGNOSIS — R0602 Shortness of breath: Secondary | ICD-10-CM

## 2023-04-25 DIAGNOSIS — R9431 Abnormal electrocardiogram [ECG] [EKG]: Secondary | ICD-10-CM | POA: Diagnosis not present

## 2023-04-25 DIAGNOSIS — I10 Essential (primary) hypertension: Secondary | ICD-10-CM | POA: Diagnosis not present

## 2023-04-25 NOTE — Progress Notes (Signed)
Cardiology Office Note:  .   Date:  04/25/2023  ID:  [name], DOB Nov 07, 1955, MRN [id number] PCP: Sigmund Hazel, MD  [facility] Providers Cardiologist:  Lance Muss, MD {  History of Present Illness: .   [name] is a 67 y.o. female with a past medical history of shortness of breath.  Family history includes mother [name] who was a patient of [name] and had a posterior MI in 41 (age 72) treated with PTCA subsequent restenosis of the OM which was unable to be opened.   She retired recently and had been trying to be more active of late. Worse with gardening. Had some DOE at the end of the walk with her dog.  No real cardiovascular type activity.  Father had MI at age 4.  Mother had MI in her 73s.  No siblings with heart issues.  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  Shortness of breath has concerned her as it is limiting her activity.  Associated pain.  Today, she states that she has had a  sensation of a knife sticking when she was diagnosed with MVP but has not reoccurred.  She is outside quite often and sometimes has some associated shortness of breath.  We reviewed her recent labs and she is due for lipid panel and LFTs.  We have ordered this today.  Reports no shortness of breath nor dyspnea on exertion. Reports no chest pain, pressure, or tightness. No edema, orthopnea, PND. Reports no palpitations.   ROS: Pertinent ROS in HPI  Studies Reviewed: Marland Kitchen   EKG Interpretation Date/Time:  Monday April 25 2023 10:25:20 EDT Ventricular Rate:  93 PR Interval:  156 QRS Duration:  94 QT Interval:  386 QTC Calculation: 479 R Axis:   -69  Text Interpretation: Normal sinus rhythm Left anterior fascicular block When compared with ECG of 24-Feb-2008 04:10, Left anterior fascicular block is now Present Confirmed by Jari Favre [phone]) on 04/25/2023 8:18:50 PM   Echocardiogram 01/25/2022 IMPRESSIONS     1. Left  ventricular ejection fraction, by estimation, is 55 to 60%. The  left ventricle has normal function. The left ventricle has no regional  wall motion abnormalities. Left ventricular diastolic parameters are  consistent with Grade I diastolic  dysfunction (impaired relaxation). The average left ventricular global  longitudinal strain is -17.4 %.   2. Right ventricular systolic function is normal. The right ventricular  size is normal. There is normal pulmonary artery systolic pressure. The  estimated right ventricular systolic pressure is 16.0 mmHg.   3. The mitral valve is normal in structure. Trivial mitral valve  regurgitation. No evidence of mitral stenosis.   4. The aortic valve is tricuspid. Aortic valve regurgitation is not  visualized. No aortic stenosis is present.   5. The inferior vena cava is normal in size with greater than 50%  respiratory variability, suggesting right atrial pressure of 3 mmHg.   FINDINGS   Left Ventricle: Left ventricular ejection fraction, by estimation, is 55  to 60%. The left ventricle has normal function. The left ventricle has no  regional wall motion abnormalities. The average left ventricular global  longitudinal strain is -17.4 %.  The left ventricular internal cavity size was normal in size. There is no  left ventricular hypertrophy. Left ventricular diastolic parameters are  consistent with Grade I diastolic dysfunction (impaired relaxation).   Right Ventricle: The right ventricular size is normal. No increase in  right ventricular wall thickness. Right ventricular systolic function is  normal. There is normal pulmonary artery systolic pressure. The tricuspid  regurgitant velocity is 1.80 m/s, and   with an assumed right atrial pressure of 3 mmHg, the estimated right  ventricular systolic pressure is 16.0 mmHg.   Left Atrium: Left atrial size was normal in size.   Right Atrium: Right atrial size was normal in size.   Pericardium: There is  no evidence of pericardial effusion.   Mitral Valve: The mitral valve is normal in structure. Trivial mitral  valve regurgitation. No evidence of mitral valve stenosis.   Tricuspid Valve: The tricuspid valve is normal in structure. Tricuspid  valve regurgitation is trivial.   Aortic Valve: The aortic valve is tricuspid. Aortic valve regurgitation is  not visualized. No aortic stenosis is present.   Pulmonic Valve: The pulmonic valve was not well visualized. Pulmonic valve  regurgitation is not visualized.   Aorta: The aortic root and ascending aorta are structurally normal, with  no evidence of dilitation.   Venous: The inferior vena cava is normal in size with greater than 50%  respiratory variability, suggesting right atrial pressure of 3 mmHg.   IAS/Shunts: The interatrial septum was not well visualized.      Physical Exam:   VS:  BP 112/78   Pulse 93   Ht 5' 3.5" (1.613 m)   Wt 190 lb 6.4 oz (86.4 kg)   SpO2 96%   BMI 33.20 kg/m    Wt Readings from Last 3 Encounters:  04/25/23 190 lb 6.4 oz (86.4 kg)  01/06/22 191 lb (86.6 kg)  01/13/12 186 lb (84.4 kg)    GEN: Well nourished, well developed in no acute distress NECK: No JVD; No carotid bruits CARDIAC: -RRR, no murmurs, rubs, gallops RESPIRATORY:  Clear to auscultation without rales, wheezing or rhonchi  ABDOMEN: Soft, non-tender, non-distended EXTREMITIES:  No edema; No deformity   ASSESSMENT AND PLAN: .   SOB -Experiences this when she is in the heat -Continue current medications, euvolemic on exam  HLD -Continue Crestor 20 mg daily -Ordered a lipid panel and LFTs today  Abnormal EKG -No fascicular block on EKG, normal sinus rhythm, rate 93 bpm  HTN   -Continue low-sodium, heart healthy diet -Well-controlled today 112/78, continue current medication   Dispo: Follow-up in 6 months with [name] or APP  Signed, Sharlene Dory, PA-C

## 2023-04-25 NOTE — Patient Instructions (Addendum)
Medication Instructions:   Your physician recommends that you continue on your current medications as directed. Please refer to the Current Medication list given to you today.  *If you need a refill on your cardiac medications before your next appointment, please call your pharmacy*   Lab Work:  LFT AND LIPIDS RETURN FASTING OR NEAREST [facility]   If you have labs (blood work) drawn today and your tests are completely normal, you will receive your results only by: MyChart Message (if you have MyChart) OR A paper copy in the mail If you have any lab test that is abnormal or we need to change your treatment, we will call you to review the results.   Testing/Procedures: NONE ORDERED  TODAY     Follow-Up: At [facility], you and your health needs are our priority.  As part of our continuing mission to provide you with exceptional heart care, we have created designated Provider Care Teams.  These Care Teams include your primary Cardiologist (physician) and Advanced Practice Providers (APPs -  Physician Assistants and Nurse Practitioners) who all work together to provide you with the care you need, when you need it.  We recommend signing up for the patient portal called "MyChart".  Sign up information is provided on this After Visit Summary.  MyChart is used to connect with patients for Virtual Visits (Telemedicine).  Patients are able to view lab/test results, encounter notes, upcoming appointments, etc.  Non-urgent messages can be sent to your provider as well.   To learn more about what you can do with MyChart, go to [email or web address].    Your next appointment:   6 month(s)  Provider:   Lance Muss, MD Asa Lente   Other Instructions

## 2023-05-06 ENCOUNTER — Ambulatory Visit: Payer: Medicare Other | Attending: Physician Assistant

## 2023-05-06 DIAGNOSIS — E785 Hyperlipidemia, unspecified: Secondary | ICD-10-CM | POA: Diagnosis not present

## 2023-05-11 ENCOUNTER — Other Ambulatory Visit: Payer: Self-pay | Admitting: Interventional Cardiology

## 2023-07-19 ENCOUNTER — Telehealth: Payer: Self-pay | Admitting: Physician Assistant

## 2023-07-19 NOTE — Telephone Encounter (Signed)
Pt c/o medication issue:  1. Name of Medication:   rosuvastatin (CRESTOR) 20 MG tablet   2. How are you currently taking this medication (dosage and times per day)?   As prescribed  3. Are you having a reaction (difficulty breathing--STAT)?   Headache  4. What is your medication issue?   Patient stated she has been getting headaches since starting on this medication and wants to get alternate medication.

## 2023-07-19 NOTE — Telephone Encounter (Signed)
Left voicemail to return call to office.

## 2023-07-20 MED ORDER — ATORVASTATIN CALCIUM 10 MG PO TABS: 10.0000 mg | ORAL_TABLET | Freq: Every day | ORAL | 3 refills | Status: DC

## 2023-07-20 NOTE — Telephone Encounter (Signed)
That is the most well-tolerated statin but we can try atorvastatin 10 mg daily.  She will need to try to statins before she qualifies for a nonstatin cholesterol medicine.  Thanks.   Sharlene Dory, PA-C     Called patient back to let her know of Jari Favre PA recommendations. Patient verbalized understanding and she will try the atorvastatin 10 mg daily.

## 2023-07-20 NOTE — Telephone Encounter (Signed)
Patient is returning call. Requesting return call.  

## 2023-09-14 DIAGNOSIS — E785 Hyperlipidemia, unspecified: Secondary | ICD-10-CM | POA: Diagnosis not present

## 2023-09-14 DIAGNOSIS — M1990 Unspecified osteoarthritis, unspecified site: Secondary | ICD-10-CM | POA: Diagnosis not present

## 2023-09-14 DIAGNOSIS — I10 Essential (primary) hypertension: Secondary | ICD-10-CM | POA: Diagnosis not present

## 2023-09-14 DIAGNOSIS — R195 Other fecal abnormalities: Secondary | ICD-10-CM | POA: Diagnosis not present

## 2023-09-14 DIAGNOSIS — Z860100 Personal history of colon polyps, unspecified: Secondary | ICD-10-CM | POA: Diagnosis not present

## 2023-09-14 DIAGNOSIS — I2510 Atherosclerotic heart disease of native coronary artery without angina pectoris: Secondary | ICD-10-CM | POA: Diagnosis not present

## 2023-09-14 DIAGNOSIS — I700 Atherosclerosis of aorta: Secondary | ICD-10-CM | POA: Diagnosis not present

## 2023-09-22 DIAGNOSIS — S00522A Blister (nonthermal) of oral cavity, initial encounter: Secondary | ICD-10-CM | POA: Diagnosis not present

## 2023-11-07 DIAGNOSIS — Z1211 Encounter for screening for malignant neoplasm of colon: Secondary | ICD-10-CM | POA: Diagnosis not present

## 2023-11-07 DIAGNOSIS — Z1212 Encounter for screening for malignant neoplasm of rectum: Secondary | ICD-10-CM | POA: Diagnosis not present

## 2023-11-11 LAB — EXTERNAL GENERIC LAB PROCEDURE: COLOGUARD: NEGATIVE

## 2023-11-11 LAB — COLOGUARD: COLOGUARD: NEGATIVE

## 2024-01-04 DIAGNOSIS — Z1231 Encounter for screening mammogram for malignant neoplasm of breast: Secondary | ICD-10-CM | POA: Diagnosis not present

## 2024-04-09 DIAGNOSIS — I2510 Atherosclerotic heart disease of native coronary artery without angina pectoris: Secondary | ICD-10-CM | POA: Diagnosis not present

## 2024-04-09 DIAGNOSIS — I10 Essential (primary) hypertension: Secondary | ICD-10-CM | POA: Diagnosis not present

## 2024-04-09 DIAGNOSIS — M1990 Unspecified osteoarthritis, unspecified site: Secondary | ICD-10-CM | POA: Diagnosis not present

## 2024-04-09 DIAGNOSIS — E785 Hyperlipidemia, unspecified: Secondary | ICD-10-CM | POA: Diagnosis not present

## 2024-05-10 DIAGNOSIS — E785 Hyperlipidemia, unspecified: Secondary | ICD-10-CM | POA: Diagnosis not present

## 2024-05-10 DIAGNOSIS — I10 Essential (primary) hypertension: Secondary | ICD-10-CM | POA: Diagnosis not present

## 2024-05-10 DIAGNOSIS — I2510 Atherosclerotic heart disease of native coronary artery without angina pectoris: Secondary | ICD-10-CM | POA: Diagnosis not present

## 2024-05-10 DIAGNOSIS — M1990 Unspecified osteoarthritis, unspecified site: Secondary | ICD-10-CM | POA: Diagnosis not present

## 2024-06-10 DIAGNOSIS — I10 Essential (primary) hypertension: Secondary | ICD-10-CM | POA: Diagnosis not present

## 2024-06-10 DIAGNOSIS — E785 Hyperlipidemia, unspecified: Secondary | ICD-10-CM | POA: Diagnosis not present

## 2024-06-10 DIAGNOSIS — I2510 Atherosclerotic heart disease of native coronary artery without angina pectoris: Secondary | ICD-10-CM | POA: Diagnosis not present

## 2024-06-10 DIAGNOSIS — M1990 Unspecified osteoarthritis, unspecified site: Secondary | ICD-10-CM | POA: Diagnosis not present

## 2024-07-10 DIAGNOSIS — M1990 Unspecified osteoarthritis, unspecified site: Secondary | ICD-10-CM | POA: Diagnosis not present

## 2024-07-10 DIAGNOSIS — I2510 Atherosclerotic heart disease of native coronary artery without angina pectoris: Secondary | ICD-10-CM | POA: Diagnosis not present

## 2024-07-10 DIAGNOSIS — I10 Essential (primary) hypertension: Secondary | ICD-10-CM | POA: Diagnosis not present

## 2024-07-10 DIAGNOSIS — E785 Hyperlipidemia, unspecified: Secondary | ICD-10-CM | POA: Diagnosis not present

## 2024-10-10 ENCOUNTER — Ambulatory Visit: Admitting: Student in an Organized Health Care Education/Training Program

## 2024-10-10 ENCOUNTER — Encounter: Payer: Self-pay | Admitting: Student in an Organized Health Care Education/Training Program

## 2024-10-10 VITALS — BP 106/68 | HR 76 | Ht 63.0 in | Wt 186.4 lb

## 2024-10-10 DIAGNOSIS — I10 Essential (primary) hypertension: Secondary | ICD-10-CM

## 2024-10-10 DIAGNOSIS — E785 Hyperlipidemia, unspecified: Secondary | ICD-10-CM

## 2024-10-10 NOTE — Progress Notes (Signed)
 " Cardiology Office Note:   Date:  10/10/2024  ID:  [name], DOB 01-27-1956, MRN [id number] PCP: Cleotilde Planas, MD  [facility] Providers Cardiologist:  Georganna Archer, MD { Chief Complaint:  Chief Complaint  Patient presents with   Follow-up      History of Present Illness:   [name] is a 68 y.o. female with a PMH of nonobstructive CAD, HTN, HLD, and LAFB who presents for follow up.  Patient is doing well today without complaints.  She is taking all her medications as prescribed without side effect.   Past Medical History:  Diagnosis Date   Allergic rhinitis    Anxiety    Diverticulitis    High cholesterol    History of colonic polyps    Hyperlipidemia    Hypertension    Menopausal and female climacteric states    Menopausal symptom    Obesity    Osteoarthritis      Studies Reviewed:    EKG:  EKG Interpretation Date/Time:  Wednesday October 10 2024 14:44:35 EST Ventricular Rate:  76 PR Interval:  172 QRS Duration:  90 QT Interval:  414 QTC Calculation: 465 R Axis:   -53  Text Interpretation: Normal sinus rhythm Low voltage QRS Left anterior fascicular block When compared with ECG of 25-Apr-2023 10:25, No significant change was found Confirmed by Archer Georganna [phone]) on 10/10/2024 2:49:53 PM     Cardiac Studies & Procedures   ______________________________________________________________________________________________     ECHOCARDIOGRAM  ECHOCARDIOGRAM COMPLETE 01/25/2022  Narrative ECHOCARDIOGRAM REPORT    Patient Name:   [name]  Date of Exam: 01/25/2022 Medical Rec #:  [id number]     Height:       63.5 in Accession #:    [id number]    Weight:       191.0 lb Date of Birth:  September 01, 1956     BSA:          1.907 m Patient Age:    66 years      BP:           127/84 mmHg Patient Gender: F             HR:           74 bpm. Exam Location:  [address]  Procedure: 2D Echo, Cardiac Doppler, Color Doppler and Strain  Analysis  Indications:    R06.09 DOE  History:        Patient has no prior history of Echocardiogram examinations. Abnormal ECG; Risk Factors:Hypertension and HLD.  Sonographer:    Waldo Guadalajara RCS Referring Phys: 45 JAYADEEP S VARANASI  IMPRESSIONS   1. Left ventricular ejection fraction, by estimation, is 55 to 60%. The left ventricle has normal function. The left ventricle has no regional wall motion abnormalities. Left ventricular diastolic parameters are consistent with Grade I diastolic dysfunction (impaired relaxation). The average left ventricular global longitudinal strain is -17.4 %. 2. Right ventricular systolic function is normal. The right ventricular size is normal. There is normal pulmonary artery systolic pressure. The estimated right ventricular systolic pressure is 16.0 mmHg. 3. The mitral valve is normal in structure. Trivial mitral valve regurgitation. No evidence of mitral stenosis. 4. The aortic valve is tricuspid. Aortic valve regurgitation is not visualized. No aortic stenosis is present. 5. The inferior vena cava is normal in size with greater than 50% respiratory variability, suggesting right atrial pressure of 3 mmHg.  FINDINGS Left Ventricle: Left ventricular ejection fraction, by estimation, is 55 to 60%.  The left ventricle has normal function. The left ventricle has no regional wall motion abnormalities. The average left ventricular global longitudinal strain is -17.4 %. The left ventricular internal cavity size was normal in size. There is no left ventricular hypertrophy. Left ventricular diastolic parameters are consistent with Grade I diastolic dysfunction (impaired relaxation).  Right Ventricle: The right ventricular size is normal. No increase in right ventricular wall thickness. Right ventricular systolic function is normal. There is normal pulmonary artery systolic pressure. The tricuspid regurgitant velocity is 1.80 m/s, and with an assumed right  atrial pressure of 3 mmHg, the estimated right ventricular systolic pressure is 16.0 mmHg.  Left Atrium: Left atrial size was normal in size.  Right Atrium: Right atrial size was normal in size.  Pericardium: There is no evidence of pericardial effusion.  Mitral Valve: The mitral valve is normal in structure. Trivial mitral valve regurgitation. No evidence of mitral valve stenosis.  Tricuspid Valve: The tricuspid valve is normal in structure. Tricuspid valve regurgitation is trivial.  Aortic Valve: The aortic valve is tricuspid. Aortic valve regurgitation is not visualized. No aortic stenosis is present.  Pulmonic Valve: The pulmonic valve was not well visualized. Pulmonic valve regurgitation is not visualized.  Aorta: The aortic root and ascending aorta are structurally normal, with no evidence of dilitation.  Venous: The inferior vena cava is normal in size with greater than 50% respiratory variability, suggesting right atrial pressure of 3 mmHg.  IAS/Shunts: The interatrial septum was not well visualized.   LEFT VENTRICLE PLAX 2D LVIDd:         4.30 cm   Diastology LVIDs:         2.60 cm   LV e' medial:    6.09 cm/s LV PW:         0.80 cm   LV E/e' medial:  8.3 LV IVS:        0.80 cm   LV e' lateral:   5.55 cm/s LVOT diam:     2.00 cm   LV E/e' lateral: 9.1 LV SV:         54 LV SV Index:   28        2D Longitudinal Strain LVOT Area:     3.14 cm  2D Strain GLS (A2C):   -16.9 % 2D Strain GLS (A3C):   -16.5 % 2D Strain GLS (A4C):   -18.7 % 2D Strain GLS Avg:     -17.4 %  RIGHT VENTRICLE RV Basal diam:  2.60 cm RV S prime:     14.10 cm/s TAPSE (M-mode): 1.8 cm RVSP:           16.0 mmHg  LEFT ATRIUM             Index        RIGHT ATRIUM           Index LA diam:        3.30 cm 1.73 cm/m   RA Pressure: 3.00 mmHg LA Vol (A2C):   42.9 ml 22.49 ml/m  RA Area:     8.50 cm LA Vol (A4C):   27.9 ml 14.63 ml/m  RA Volume:   15.70 ml  8.23 ml/m LA Biplane Vol: 34.7 ml 18.19  ml/m AORTIC VALVE LVOT Vmax:   94.20 cm/s LVOT Vmean:  64.500 cm/s LVOT VTI:    0.173 m  AORTA Ao Root diam: 3.00 cm Ao Asc diam:  3.00 cm  MITRAL VALVE  TRICUSPID VALVE MV Area (PHT):             TR Peak grad:   13.0 mmHg MV Decel Time:             TR Vmax:        180.00 cm/s MV E velocity: 50.30 cm/s  Estimated RAP:  3.00 mmHg MV A velocity: 69.20 cm/s  RVSP:           16.0 mmHg MV E/A ratio:  0.73 SHUNTS Systemic VTI:  0.17 m Systemic Diam: 2.00 cm  Lonni Nanas MD Electronically signed by Lonni Nanas MD Signature Date/Time: 01/25/2022/1:46:53 PM    Final      CT SCANS  CT CORONARY MORPH W/CTA COR W/SCORE 01/20/2022  Addendum 01/26/2022 12:28 PM ADDENDUM REPORT: 01/26/2022 12:26  ADDENDUM: This is a 68 year old female - initial reports inadvertently stated female.   Electronically Signed By: Kardie  Tobb D.O. On: 01/26/2022 12:26  Addendum 01/20/2022  2:02 PM ADDENDUM REPORT: 01/20/2022 14:00  CLINICAL DATA:  This is a 68 year old female with anginal symptoms.  EXAM: Cardiac/Coronary  CTA  TECHNIQUE: The patient was scanned on a [employer].  FINDINGS: A 100 kV prospective scan was triggered in the descending thoracic aorta at 111 HU's. Axial non-contrast 3 mm slices were carried out through the heart. The data set was analyzed on a dedicated work station and scored using the Agatson method. Gantry rotation speed was 250 msecs and collimation was .6 mm. No beta blockade and 0.8 mg of sl NTG was given. The 3D data set was reconstructed in 5% intervals of the 67-82 % of the R-R cycle. Diastolic phases were analyzed on a dedicated work station using MPR, MIP and VRT modes. The patient received 80 cc of contrast.  Aorta: Normal size.  No calcifications.  No dissection.  Aortic Valve:  Trileaflet.  No calcifications.  Coronary Arteries:  Normal coronary origin.  Right dominance.  RCA is a large dominant artery  that gives rise to PDA and PLA. There is no plaque.  Left main is a large artery that gives rise to LAD and LCX arteries.  LAD is a large vessel. There is mild (24-49%) calcified plaque in the proximal LAD. The mid and distal LAD with no plaques.  LCX is a non-dominant artery that gives rise to one large OM1 branch. There is no plaque.  Coronary Calcium  Score:  Left main: 0  Left anterior descending artery: 71.9  Left circumflex artery: 0  Right coronary artery: 0  Total: 71.9  Percentile: 78  Other findings:  Normal pulmonary vein drainage into the left atrium.  Normal left atrial appendage without a thrombus.  Normal size of the pulmonary artery.  IMPRESSION: 1. Coronary calcium  score of 71.9. This was 69 percentile for age and sex matched control.  2. Normal coronary origin with right dominance.  3. CAD-RADS 2. Mild non-obstructive CAD (25-49%). Consider non-atherosclerotic causes of chest pain. Consider preventive therapy and risk factor modification.   Electronically Signed By: Kardie  Tobb D.O. On: 01/20/2022 14:00  Narrative EXAM: OVER-READ INTERPRETATION  CT CHEST  The following report is an over-read performed by radiologist Dr. Toribio Aye of [facility] on 01/20/2022. This over-read does not include interpretation of cardiac or coronary anatomy or pathology. The coronary calcium  score/coronary CTA interpretation by the cardiologist is attached.  COMPARISON:  None.  FINDINGS: Atherosclerotic calcifications in the thoracic aorta. Within the visualized portions of the thorax there  are no suspicious appearing pulmonary nodules or masses, there is no acute consolidative airspace disease, no pleural effusions, no pneumothorax and no lymphadenopathy. Visualized portions of the upper abdomen are unremarkable. There are no aggressive appearing lytic or blastic lesions noted in the visualized portions of the  skeleton.  IMPRESSION: 1.  Aortic Atherosclerosis (ICD10-I70.0).  Electronically Signed: By: Toribio Aye M.D. On: 01/20/2022 09:19     ______________________________________________________________________________________________      Risk Assessment/Calculations:              Physical Exam:     VS:  BP 106/68 (BP Location: Right Arm, Patient Position: Sitting, Cuff Size: Normal)   Pulse 76   Ht 5' 3 (1.6 m)   Wt 186 lb 6.4 oz (84.6 kg)   SpO2 98%   BMI 33.02 kg/m      Wt Readings from Last 3 Encounters:  04/25/23 190 lb 6.4 oz (86.4 kg)  01/06/22 191 lb (86.6 kg)  01/13/12 186 lb (84.4 kg)     GEN: Well nourished, well developed, in no acute distress NECK: No JVD; No carotid bruits CARDIAC: RRR, no murmurs, rubs, gallops RESPIRATORY:  Clear to auscultation without rales, wheezing or rhonchi  ABDOMEN: Soft, non-tender, non-distended, normal bowel sounds EXTREMITIES:  Warm and well perfused, no edema; No deformity, 2+ radial pulses PSYCH: Normal mood and affect   Assessment & Plan Essential hypertension - Blood pressure is well-controlled.  No changes. Continue lisinopril/hydrochlorothiazide 20-12.5 Check BMP Follow-up in 12 months Hyperlipidemia LDL goal <70 - Recently had lipid panel done by PCP, but I am unable to see those results. - Her atorvastatin  dose was increased to 20 mg daily in response to these results. - The patient will send the results to me when she has a chance. - I will check a lipoprotein a on her to determine how aggressive we should be with lowering her LDL. Lipoprotein a level Await results of LDL from PCP          This note was written with the assistance of a dictation microphone or AI dictation software. Please excuse any typos or grammatical errors.   Signed, Georganna Archer, MD 10/10/2024 12:50 PM    [facility]  "

## 2024-10-10 NOTE — Assessment & Plan Note (Signed)
-   Blood pressure is well-controlled.  No changes. Continue lisinopril/hydrochlorothiazide 20-12.5 Check BMP Follow-up in 12 months

## 2024-10-10 NOTE — Patient Instructions (Signed)
" °  Lab Work: LP(a) BMP  If you have labs (blood work) drawn today and your tests are completely normal, you will receive your results only by: MyChart Message (if you have MyChart) OR A paper copy in the mail If you have any lab test that is abnormal or we need to change your treatment, we will call you to review the results.  Follow-Up: At [facility], you and your health needs are our priority.  As part of our continuing mission to provide you with exceptional heart care, our providers are all part of one team.  This team includes your primary Cardiologist (physician) and Advanced Practice Providers or APPs (Physician Assistants and Nurse Practitioners) who all work together to provide you with the care you need, when you need it.  Your next appointment:   12 month(s)  Provider:   Georganna Archer, MD           "

## 2024-10-11 LAB — BASIC METABOLIC PANEL WITH GFR
BUN/Creatinine Ratio: 23 (ref 12–28)
BUN: 20 mg/dL (ref 8–27)
CO2: 24 mmol/L (ref 20–29)
Calcium: 9.8 mg/dL (ref 8.7–10.3)
Chloride: 98 mmol/L (ref 96–106)
Creatinine, Ser: 0.87 mg/dL (ref 0.57–1.00)
Glucose: 94 mg/dL (ref 70–99)
Potassium: 4.2 mmol/L (ref 3.5–5.2)
Sodium: 137 mmol/L (ref 134–144)
eGFR: 73 mL/min/1.73

## 2024-10-11 LAB — LIPOPROTEIN A (LPA): Lipoprotein (a): 152.4 nmol/L — ABNORMAL HIGH

## 2024-10-12 ENCOUNTER — Ambulatory Visit: Payer: Self-pay | Admitting: Student in an Organized Health Care Education/Training Program
